# Patient Record
Sex: Male | Born: 1935 | Race: White | Hispanic: No | Marital: Married | Smoking: Former smoker
Health system: Southern US, Community
[De-identification: ages and names within clinical notes are randomized; demographics above are authoritative.]

## PROBLEM LIST (undated history)

## (undated) DIAGNOSIS — K219 Gastro-esophageal reflux disease without esophagitis: Secondary | ICD-10-CM

## (undated) DIAGNOSIS — N183 Chronic kidney disease, stage 3 unspecified: Secondary | ICD-10-CM

## (undated) DIAGNOSIS — D0359 Melanoma in situ of other part of trunk: Secondary | ICD-10-CM

## (undated) DIAGNOSIS — D509 Iron deficiency anemia, unspecified: Secondary | ICD-10-CM

## (undated) DIAGNOSIS — M1990 Unspecified osteoarthritis, unspecified site: Secondary | ICD-10-CM

## (undated) DIAGNOSIS — K649 Unspecified hemorrhoids: Secondary | ICD-10-CM

## (undated) DIAGNOSIS — C801 Malignant (primary) neoplasm, unspecified: Secondary | ICD-10-CM

## (undated) DIAGNOSIS — K2270 Barrett's esophagus without dysplasia: Secondary | ICD-10-CM

## (undated) DIAGNOSIS — E785 Hyperlipidemia, unspecified: Secondary | ICD-10-CM

## (undated) DIAGNOSIS — K635 Polyp of colon: Secondary | ICD-10-CM

## (undated) DIAGNOSIS — I10 Essential (primary) hypertension: Secondary | ICD-10-CM

## (undated) HISTORY — DX: Unspecified osteoarthritis, unspecified site: M19.90

## (undated) HISTORY — DX: Barrett's esophagus without dysplasia: K22.70

## (undated) HISTORY — DX: Chronic kidney disease, stage 3 unspecified: N18.30

## (undated) HISTORY — DX: Hyperlipidemia, unspecified: E78.5

## (undated) HISTORY — DX: Unspecified hemorrhoids: K64.9

## (undated) HISTORY — DX: Essential (primary) hypertension: I10

## (undated) HISTORY — DX: Gastro-esophageal reflux disease without esophagitis: K21.9

## (undated) HISTORY — DX: Chronic kidney disease, stage 3 (moderate): N18.3

## (undated) HISTORY — PX: HERNIA REPAIR: SHX51

## (undated) HISTORY — DX: Malignant (primary) neoplasm, unspecified: C80.1

## (undated) HISTORY — DX: Iron deficiency anemia, unspecified: D50.9

## (undated) HISTORY — DX: Polyp of colon: K63.5

## (undated) HISTORY — PX: APPENDECTOMY: SHX54

## (undated) HISTORY — DX: Melanoma in situ of other part of trunk: D03.59

---

## 2010-09-08 DIAGNOSIS — K2270 Barrett's esophagus without dysplasia: Secondary | ICD-10-CM

## 2010-09-08 HISTORY — DX: Barrett's esophagus without dysplasia: K22.70

## 2011-12-24 DIAGNOSIS — I10 Essential (primary) hypertension: Secondary | ICD-10-CM | POA: Diagnosis not present

## 2011-12-24 DIAGNOSIS — E785 Hyperlipidemia, unspecified: Secondary | ICD-10-CM | POA: Diagnosis not present

## 2011-12-24 DIAGNOSIS — D649 Anemia, unspecified: Secondary | ICD-10-CM | POA: Diagnosis not present

## 2011-12-25 DIAGNOSIS — I10 Essential (primary) hypertension: Secondary | ICD-10-CM | POA: Diagnosis not present

## 2011-12-25 DIAGNOSIS — E785 Hyperlipidemia, unspecified: Secondary | ICD-10-CM | POA: Diagnosis not present

## 2012-01-05 DIAGNOSIS — H539 Unspecified visual disturbance: Secondary | ICD-10-CM | POA: Diagnosis not present

## 2012-04-29 DIAGNOSIS — E785 Hyperlipidemia, unspecified: Secondary | ICD-10-CM | POA: Diagnosis not present

## 2012-04-29 DIAGNOSIS — I10 Essential (primary) hypertension: Secondary | ICD-10-CM | POA: Diagnosis not present

## 2012-04-29 DIAGNOSIS — N289 Disorder of kidney and ureter, unspecified: Secondary | ICD-10-CM | POA: Diagnosis not present

## 2012-04-29 DIAGNOSIS — D649 Anemia, unspecified: Secondary | ICD-10-CM | POA: Diagnosis not present

## 2012-05-20 DIAGNOSIS — Z23 Encounter for immunization: Secondary | ICD-10-CM | POA: Diagnosis not present

## 2012-07-16 DIAGNOSIS — E785 Hyperlipidemia, unspecified: Secondary | ICD-10-CM | POA: Diagnosis not present

## 2012-07-16 DIAGNOSIS — Z1331 Encounter for screening for depression: Secondary | ICD-10-CM | POA: Diagnosis not present

## 2012-07-16 DIAGNOSIS — J329 Chronic sinusitis, unspecified: Secondary | ICD-10-CM | POA: Diagnosis not present

## 2012-08-17 DIAGNOSIS — M6520 Calcific tendinitis, unspecified site: Secondary | ICD-10-CM | POA: Diagnosis not present

## 2012-11-19 DIAGNOSIS — M545 Low back pain: Secondary | ICD-10-CM | POA: Diagnosis not present

## 2012-11-19 DIAGNOSIS — IMO0002 Reserved for concepts with insufficient information to code with codable children: Secondary | ICD-10-CM | POA: Diagnosis not present

## 2012-12-30 DIAGNOSIS — R197 Diarrhea, unspecified: Secondary | ICD-10-CM | POA: Diagnosis not present

## 2013-01-24 DIAGNOSIS — M1710 Unilateral primary osteoarthritis, unspecified knee: Secondary | ICD-10-CM | POA: Diagnosis not present

## 2013-05-02 DIAGNOSIS — Z125 Encounter for screening for malignant neoplasm of prostate: Secondary | ICD-10-CM | POA: Diagnosis not present

## 2013-05-02 DIAGNOSIS — I10 Essential (primary) hypertension: Secondary | ICD-10-CM | POA: Diagnosis not present

## 2013-05-02 DIAGNOSIS — E079 Disorder of thyroid, unspecified: Secondary | ICD-10-CM | POA: Diagnosis not present

## 2013-05-02 DIAGNOSIS — E785 Hyperlipidemia, unspecified: Secondary | ICD-10-CM | POA: Diagnosis not present

## 2013-05-20 DIAGNOSIS — H524 Presbyopia: Secondary | ICD-10-CM | POA: Diagnosis not present

## 2013-05-20 DIAGNOSIS — H25019 Cortical age-related cataract, unspecified eye: Secondary | ICD-10-CM | POA: Diagnosis not present

## 2013-05-20 DIAGNOSIS — H2510 Age-related nuclear cataract, unspecified eye: Secondary | ICD-10-CM | POA: Diagnosis not present

## 2013-05-20 DIAGNOSIS — H5200 Hypermetropia, unspecified eye: Secondary | ICD-10-CM | POA: Diagnosis not present

## 2013-05-24 DIAGNOSIS — E875 Hyperkalemia: Secondary | ICD-10-CM | POA: Diagnosis not present

## 2013-05-24 DIAGNOSIS — Z0000 Encounter for general adult medical examination without abnormal findings: Secondary | ICD-10-CM | POA: Diagnosis not present

## 2013-05-24 DIAGNOSIS — Z125 Encounter for screening for malignant neoplasm of prostate: Secondary | ICD-10-CM | POA: Diagnosis not present

## 2013-05-24 DIAGNOSIS — R197 Diarrhea, unspecified: Secondary | ICD-10-CM | POA: Diagnosis not present

## 2013-05-24 DIAGNOSIS — H9190 Unspecified hearing loss, unspecified ear: Secondary | ICD-10-CM | POA: Diagnosis not present

## 2013-05-24 DIAGNOSIS — I10 Essential (primary) hypertension: Secondary | ICD-10-CM | POA: Diagnosis not present

## 2013-05-24 DIAGNOSIS — R269 Unspecified abnormalities of gait and mobility: Secondary | ICD-10-CM | POA: Diagnosis not present

## 2013-05-31 DIAGNOSIS — R42 Dizziness and giddiness: Secondary | ICD-10-CM | POA: Diagnosis not present

## 2013-05-31 DIAGNOSIS — H905 Unspecified sensorineural hearing loss: Secondary | ICD-10-CM | POA: Diagnosis not present

## 2013-06-08 DIAGNOSIS — H905 Unspecified sensorineural hearing loss: Secondary | ICD-10-CM | POA: Diagnosis not present

## 2013-08-12 ENCOUNTER — Ambulatory Visit (INDEPENDENT_AMBULATORY_CARE_PROVIDER_SITE_OTHER): Payer: Medicare Other | Admitting: Internal Medicine

## 2013-08-12 ENCOUNTER — Encounter: Payer: Self-pay | Admitting: Internal Medicine

## 2013-08-12 VITALS — BP 130/80 | HR 72 | Ht 68.5 in | Wt 172.4 lb

## 2013-08-12 DIAGNOSIS — K2270 Barrett's esophagus without dysplasia: Secondary | ICD-10-CM

## 2013-08-12 DIAGNOSIS — K219 Gastro-esophageal reflux disease without esophagitis: Secondary | ICD-10-CM

## 2013-08-12 DIAGNOSIS — R197 Diarrhea, unspecified: Secondary | ICD-10-CM

## 2013-08-12 MED ORDER — OMEPRAZOLE 20 MG PO CPDR: 20.0000 mg | DELAYED_RELEASE_CAPSULE | Freq: Every day | ORAL | Status: DC

## 2013-08-12 MED ORDER — RESTORA PO CAPS: 1.0000 | ORAL_CAPSULE | Freq: Every day | ORAL | Status: DC

## 2013-08-12 NOTE — Patient Instructions (Signed)
We have sent the following medications to your pharmacy for you to pick up at your convenience:  Omeprazole  You have been given some samples of Restora - take one a day until they are gone.  You have been given some information on Barretts.  Call our office when you return from [place] to schedule an endoscopy

## 2013-08-12 NOTE — Progress Notes (Signed)
HISTORY OF PRESENT ILLNESS:  Eric [place] is a 79 y.o. male below listed medical history who presents today to establish as a GI patient. He has a history of adenomatous colon polyps. Reflux esophagitis and Barrett's esophagus as well. The patient is accompanied by his wife. Outside records from [place] reviewed. Patient has a history of colon polyps and underwent surveillance colonoscopy 04/08/2011. The examination was complete to the cecum with good preparation. In addition to mild diverticulosis he was noted to have a 5 mm descending colon polyp which was removed and found to be a tubular adenoma. Followup in 4 years recommended. Next, in August of 2012 he underwent upper endoscopy to evaluate anemia. He was said to have grade 2 esophagitis as well as gastritis with multiple erosions. Flattened duodenal mucosa. Biopsies of the duodenum were normal. Biopsies of the stomach revealed mild gastritis without Helicobacter pylori. Biopsies of the esophagus revealed mild esophagitis as well as "Barrett's esophagus". No dysplasia. No Barrett's described on the endoscopic report. Patient states that he was on Nexium for a while. January 2014 he received a recall letter for upper endoscopy. He inquires about this. He denies reflux symptoms such as heartburn regurgitation water brash or dysphagia. His only other GI complaints are increased urgency with loose stools intermittently. Occasionally at night. Hyperactive bowel sounds. No bleeding or incontinence. No change in medications. Outside laboratories have been reviewed and are unremarkable. Normal B12, iron studies, CRP, and TSH. The patient and his wife Eric [place] in a few weeks. They will be there for 3 months in a timeshare  REVIEW OF SYSTEMS:  All non-GI ROS negative upon full and comprehensive review  Past Medical History  Diagnosis Date  . Colon polyps   . Hemorrhoids   . GERD (gastroesophageal reflux disease)   . Osteoarthritis   . Melanoma in  situ of trunk   . Hypertension   . Hyperlipidemia   . Iron deficiency anemia   . Chronic kidney disease (CKD), stage III (moderate)     Past Surgical History  Procedure Laterality Date  . Hernia repair    . Appendectomy      Social History Eric [place]  reports that he quit smoking about 14 years ago. His smoking use included Cigarettes. He smoked 0.50 packs per day. He has never used smokeless tobacco. He reports that he drinks alcohol. He reports that he does not use illicit drugs.  family history includes Arthritis in his father and mother; Breast cancer in his mother; Diabetes in his mother; Lung cancer in his father.  No Known Allergies     PHYSICAL EXAMINATION:  Vital signs: BP 130/80  Pulse 72  Ht 5' 8.5" (1.74 m)  Wt 172 lb 6.4 oz (78.2 kg)  BMI 25.83 kg/m2  Constitutional: generally well-appearing, no acute distress Psychiatric: alert and oriented x3, cooperative Eyes: extraocular movements intact, anicteric, conjunctiva pink Mouth: oral pharynx moist, no lesions Neck: supple no lymphadenopathy Cardiovascular: heart regular rate and rhythm, no murmur Lungs: clear to auscultation bilaterally Abdomen: soft, nontender, nondistended, no obvious ascites, no peritoneal signs, normal bowel sounds, no organomegaly Rectal: Omitted Extremities: no lower extremity edema bilaterally Skin: no lesions on visible extremities Neuro: No focal deficits. No asterixis.    ASSESSMENT:  #1. GERD. Endoscopic erosive esophagitis. Currently asymptomatic off PPI #2. "Barrett's esophagus" on esophageal biopsy. Clinical significance uncertain. Needs clarified . Comprehensive discussion today on Barrett's esophagus with the patient and his wife. As well, literature on the topic provided for  his review #3. History of adenomatous colon polyps. Diminutive adenoma 2012. Typical followup would be 5 years later, or 2017. At that time the patient will be 82. Question need for ongoing  surveillance. #4. General medical problems. Stable. #5. Complaints of intermittent loose stools with urgency. Hyperactive bowel sounds. Nonspecific. No alarm features.   PLAN:  #1. Probiotic daily for 3 weeks. See if this helps with change in bowel habits and hyperactive bowel sounds. Multiple samples given #2. Given history of erosive esophagitis and questionable history of Barrett's, would place him on once daily PPI. Prescribe omeprazole 20 mg daily. #3. After being on PPI for a minimum of 8 weeks would then schedule upper endoscopy to reassess his esophageal mucosa and biopsy as needed. The patient and his wife will contact the office when it returns from [place].The nature of the procedure, as well as the risks, benefits, and alternatives were carefully and thoroughly reviewed with the patient. Ample time for discussion and questions allowed. The patient understood, was satisfied, and agreed to proceed. #4. Consider surveillance colonoscopy at age 72, but likely unnecessary #5. Ongoing general medical care with Eric [place]

## 2014-01-02 ENCOUNTER — Encounter: Payer: Self-pay | Admitting: Internal Medicine

## 2014-01-05 ENCOUNTER — Telehealth: Payer: Self-pay | Admitting: Internal Medicine

## 2014-01-05 ENCOUNTER — Encounter: Payer: Self-pay | Admitting: *Deleted

## 2014-01-05 NOTE — Telephone Encounter (Signed)
Pt states he started having rectal bleeding on Sunday. States that it is bright red blood and it colors the toilet water. Pt scheduled to see Alonza Bogus PA tomorrow at 1:30pm. Pt aware of appt.

## 2014-01-06 ENCOUNTER — Other Ambulatory Visit (INDEPENDENT_AMBULATORY_CARE_PROVIDER_SITE_OTHER): Payer: Medicare Other

## 2014-01-06 ENCOUNTER — Ambulatory Visit (INDEPENDENT_AMBULATORY_CARE_PROVIDER_SITE_OTHER): Payer: Medicare Other | Admitting: Gastroenterology

## 2014-01-06 ENCOUNTER — Encounter: Payer: Self-pay | Admitting: Gastroenterology

## 2014-01-06 VITALS — BP 124/62 | HR 68 | Ht 68.5 in | Wt 174.0 lb

## 2014-01-06 DIAGNOSIS — K2270 Barrett's esophagus without dysplasia: Secondary | ICD-10-CM

## 2014-01-06 DIAGNOSIS — K625 Hemorrhage of anus and rectum: Secondary | ICD-10-CM

## 2014-01-06 LAB — CBC
HEMATOCRIT: 34 % — AB (ref 39.0–52.0)
Hemoglobin: 11.6 g/dL — ABNORMAL LOW (ref 13.0–17.0)
MCHC: 34 g/dL (ref 30.0–36.0)
MCV: 95.8 fl (ref 78.0–100.0)
Platelets: 174 10*3/uL (ref 150.0–400.0)
RBC: 3.55 Mil/uL — AB (ref 4.22–5.81)
RDW: 14.6 % (ref 11.5–14.6)
WBC: 5.9 10*3/uL (ref 4.5–10.5)

## 2014-01-06 MED ORDER — OMEPRAZOLE 20 MG PO CPDR: 20.0000 mg | DELAYED_RELEASE_CAPSULE | Freq: Every day | ORAL | Status: DC

## 2014-01-06 MED ORDER — HYDROCORTISONE ACETATE 25 MG RE SUPP: 25.0000 mg | Freq: Two times a day (BID) | RECTAL | Status: DC

## 2014-01-06 MED ORDER — OMEPRAZOLE 20 MG PO CPDR: 20.0000 mg | DELAYED_RELEASE_CAPSULE | Freq: Every day | ORAL | Status: AC

## 2014-01-06 NOTE — Patient Instructions (Addendum)
You have been scheduled for an endoscopy with propofol with Dr Henrene Pastor. Please follow written instructions given to you at your visit today. If you use inhalers (even only as needed), please bring them with you on the day of your procedure. Your physician has requested that you go to www.startemmi.com and enter the access code given to you at your visit today. This web site gives a general overview about your procedure. However, you should still follow specific instructions given to you by our office regarding your preparation for the procedure.  We have sent the following medications to [facility] for you to pick up at your convenience: Anusol Suppositories Prilosec, thirty day supply  We have sent the following medications to [facility]: Prilosec, ninety day supply   Your physician has requested that you go to the basement for the following lab work before leaving today: CBC _________________________________________________________________________________________________________________________________________________________________________________________________  Pearson Grippe A sitz bath is a warm water bath taken in the sitting position that covers only the hips and buttocks. It may be used for either healing or hygiene purposes. Sitz baths are also used to relieve pain, itching, or muscle spasms. The water may contain medicine. Moist heat will help you heal and relax.  HOME CARE INSTRUCTIONS  Take 3 to 4 sitz baths a day. 1. Fill the bathtub half full with warm water. 2. Sit in the water and open the drain a little. 3. Turn on the warm water to keep the tub half full. Keep the water running constantly. 4. Soak in the water for 15 to 20 minutes. 5. After the sitz bath, pat the affected area dry first. SEEK MEDICAL CARE IF:  You get worse instead of better. Stop the sitz baths if you get worse. MAKE SURE YOU:  Understand these instructions.  Will watch your condition.  Will get help right  away if you are not doing well or get worse. Document Released: 05/17/2004 Document Revised: 05/19/2012 Document Reviewed: 11/22/2010 [facility] Patient Information 2014 [place], [place].

## 2014-01-06 NOTE — Progress Notes (Signed)
01/06/2014 Eric Barker [id number] 07-19-1934   History of Present Illness:  This is a pleasant 80 year old male who presents to our office today with his wife for complaints of rectal bleeding.  This began on Sunday when he had a hard stool.  He normally does not have a problem with hard stools/constipation.  On Sunday and Monday it was a significant amount of bright red blood in the toilet bowl and splashed on the sides of the bowl.  It has subsided, but is still present with BM's.  He was even passing small amounts of blood when he would pass flatus.  No abdominal or rectal pain.  His last colonoscopy was in 03/2011 in [place] at which time he was noted to have hemorrhoids, diverticulosis, and one polyp removed from the ascending colon that was a TA.  He has Barrett's esophagus and GERD and is currently taking omeprazole 20 mg once daily for those symptoms.  He would like refills on that medication.  He is scheduled for an EGD for surveillance of his Barrett's on 6/2 with Dr. Henrene Pastor.   Current Medications, Allergies, Past Medical History, Past Surgical History, Family History and Social History were reviewed in [employer] record.   Physical Exam: BP 124/62  Pulse 68  Ht 5' 8.5" (1.74 m)  Wt 174 lb (78.926 kg)  BMI 26.07 kg/m2 General: Well developed male in no acute distress Head: Normocephalic and atraumatic Eyes:  Sclerae anicteric, conjunctiva pink  Ears: Normal auditory acuity Lungs: Clear throughout to auscultation Heart: Regular rate and rhythm Abdomen: Soft, non-distended.  Normal bowel sounds.  Non-tender. Rectal:  Large external hemorrhoid noted without active bleeding.  No masses felt on DRE.  Trace amount of light brown stool noted on exam glove with no gross blood, but was heme positive. Musculoskeletal: Symmetrical with no gross deformities  Extremities: No edema  Neurological: Alert oriented x 4, grossly non-focal Psychological:  Alert and  cooperative. Normal mood and affect  Assessment and Recommendations: -Rectal bleeding:  Likely outlet bleeding such as hemorrhoidal or low-grade diverticular bleeding.  Will give rectal care instructions.  Will give hydrocortisone suppositories to use twice daily.  Will check Hgb today. -Barrett's esophagus:  Scheduled for EGD on 6/2 with Dr. Henrene Pastor.  Will discuss instructions today and cancel pre-visit.  Continue omeprazole 20 mg daily and will refill that medication.

## 2014-01-09 NOTE — Progress Notes (Signed)
Agree 

## 2014-01-12 ENCOUNTER — Telehealth: Payer: Self-pay | Admitting: Gastroenterology

## 2014-01-12 DIAGNOSIS — K625 Hemorrhage of anus and rectum: Secondary | ICD-10-CM

## 2014-01-12 NOTE — Telephone Encounter (Signed)
See Dr. Blanch Media note.  Thank you,  Jess

## 2014-01-12 NOTE — Telephone Encounter (Signed)
Jess, See him in the office tomorrow. We will see what the next 24 hours holds. Repeat CBC tomorrow. If we need to, I have an 8 AM opening Monday for colonoscopy. We can decide tomorrow. I am supervising. Thanks

## 2014-01-12 NOTE — Telephone Encounter (Signed)
Patient saw Alonza Bogus, PA last Friday. He has been using the suppositories but is not better. Wife states he is probably worse. He had diarrhea all day yesterday, some of it bloody.(bright, red). Yesterday, he was bloated and had gas. States he had stomach pain below belly button that was all across his abdomen. He is up today and no problems yet. He is worried because his grandson graduates from college next week and he is not sure he will be able to attend at this point. Please, advise.

## 2014-01-12 NOTE — Telephone Encounter (Signed)
Dr. Henrene Pastor,  Could you review and let me know how else you would like me to proceed with this patient?  Thank you,  Jess

## 2014-01-12 NOTE — Telephone Encounter (Signed)
Spoke with patient's wife and scheduled OV with Tye Savoy, NP on 01/13/14 at 10:00 AM. CBC in EPIC and patient to have this prior to OV.

## 2014-01-13 ENCOUNTER — Ambulatory Visit (INDEPENDENT_AMBULATORY_CARE_PROVIDER_SITE_OTHER): Payer: Medicare Other | Admitting: Nurse Practitioner

## 2014-01-13 ENCOUNTER — Other Ambulatory Visit (INDEPENDENT_AMBULATORY_CARE_PROVIDER_SITE_OTHER): Payer: Medicare Other

## 2014-01-13 ENCOUNTER — Encounter: Payer: Self-pay | Admitting: Nurse Practitioner

## 2014-01-13 VITALS — BP 146/74 | HR 78 | Ht 68.5 in | Wt 170.0 lb

## 2014-01-13 DIAGNOSIS — K625 Hemorrhage of anus and rectum: Secondary | ICD-10-CM

## 2014-01-13 DIAGNOSIS — K648 Other hemorrhoids: Secondary | ICD-10-CM

## 2014-01-13 LAB — CBC WITH DIFFERENTIAL/PLATELET
Basophils Absolute: 0 10*3/uL (ref 0.0–0.1)
Basophils Relative: 0.8 % (ref 0.0–3.0)
Eosinophils Absolute: 0.1 10*3/uL (ref 0.0–0.7)
Eosinophils Relative: 1.4 % (ref 0.0–5.0)
HCT: 35.3 % — ABNORMAL LOW (ref 39.0–52.0)
Hemoglobin: 12 g/dL — ABNORMAL LOW (ref 13.0–17.0)
Lymphocytes Relative: 25.5 % (ref 12.0–46.0)
Lymphs Abs: 1.2 10*3/uL (ref 0.7–4.0)
MCHC: 33.9 g/dL (ref 30.0–36.0)
MCV: 95.4 fl (ref 78.0–100.0)
MONO ABS: 0.5 10*3/uL (ref 0.1–1.0)
Monocytes Relative: 11.2 % (ref 3.0–12.0)
NEUTROS PCT: 61.1 % (ref 43.0–77.0)
Neutro Abs: 2.8 10*3/uL (ref 1.4–7.7)
PLATELETS: 221 10*3/uL (ref 150.0–400.0)
RBC: 3.71 Mil/uL — ABNORMAL LOW (ref 4.22–5.81)
RDW: 13.9 % (ref 11.5–15.5)
WBC: 4.6 10*3/uL (ref 4.0–10.5)

## 2014-01-13 MED ORDER — HYDROCORTISONE 2.5 % RE CREA: 1.0000 "application " | TOPICAL_CREAM | Freq: Two times a day (BID) | RECTAL | Status: DC

## 2014-01-13 NOTE — Progress Notes (Signed)
     History of Present Illness:  This is a 80 year old male who was seen here a few days ago by Alonza Bogus, P.A for rectal bleeding subsequent to  passage of a hard stool. He normally does not have a problem with hard stools/constipation. On exam patient was found to have a large external hemorrhoid and was prescribed hydrocortisone suppositories twice daily. Hemoglobin on that day was 11.6. This morning a repeat hemoglobin was 12.  His last colonoscopy was in 03/2011 in [place] at which time he was noted to have hemorrhoids, diverticulosis, and one polyp removed from the ascending colon that was a TA.   Patient is slightly better after a week of steroid suppositories. He did start stool softeners and subsequently developed some abdominal discomfort followed by multiple loose stools. The last few stools contained scant amount of blood.   Current Medications, Allergies, Past Medical History, Past Surgical History, Family History and Social History were reviewed in [employer] record.  Physical Exam: General: Pleasant, well developed , white male in no acute distress Head: Normocephalic and atraumatic Eyes:  sclerae anicteric, conjunctiva pink  Ears: Normal auditory acuity Lungs: Clear throughout to auscultation Heart: Regular rate and rhythm Abdomen: Soft, non distended, non-tender. No masses, no hepatomegaly. Normal bowel sounds Rectal: mildly inflamed external hemorrhoid. On anoscopy there was an inflamed left lateral internal hemorrhoid Neurological: Alert oriented x 4, grossly nonfocal Psychological:  Alert and cooperative. Normal mood and affect  Assessment and Recommendations:  55. 80 year old male with painless rectal bleeding with BMs. He has an internal hemorrhoid on exam. Advised patient to complete the remaining prescription of steroid cream.Following that he will return for recheck. If hemorrhoidal swelling resolves but bleeding persists patient will need  further workup.   2. Bowel change. Two year history of loose stools. Suspect this is functional but will address in more depth at follow up visit  3. Barrett's esophagus. He is for surveillance EGD next month

## 2014-01-13 NOTE — Patient Instructions (Addendum)
You can purchase Glycerin Suppositories over the counter and please use as needed for constipation.  Please stop all stool softeners.  Please call us back at [phone] the week of the 01-30-2014 to schedule an appointment with one of our extenders in the office for follow up visit.   We have sent the following medications to your pharmacy for you to pick up at your convenience: Proctozone 2.5 % Comes with applicator for you to insert into your rectum twice daily

## 2014-01-14 ENCOUNTER — Encounter: Payer: Self-pay | Admitting: Nurse Practitioner

## 2014-01-14 DIAGNOSIS — K648 Other hemorrhoids: Secondary | ICD-10-CM | POA: Insufficient documentation

## 2014-01-16 NOTE — Progress Notes (Signed)
Agree with Initial assessment and plans as outlined

## 2014-01-31 DIAGNOSIS — Z6825 Body mass index (BMI) 25.0-25.9, adult: Secondary | ICD-10-CM | POA: Diagnosis not present

## 2014-01-31 DIAGNOSIS — IMO0002 Reserved for concepts with insufficient information to code with codable children: Secondary | ICD-10-CM | POA: Diagnosis not present

## 2014-02-07 ENCOUNTER — Encounter: Payer: Self-pay | Admitting: Internal Medicine

## 2014-02-07 ENCOUNTER — Ambulatory Visit (AMBULATORY_SURGERY_CENTER): Payer: Medicare Other | Admitting: Internal Medicine

## 2014-02-07 VITALS — BP 159/104 | HR 74 | Temp 98.2°F | Resp 20 | Ht 68.0 in | Wt 170.0 lb

## 2014-02-07 DIAGNOSIS — I10 Essential (primary) hypertension: Secondary | ICD-10-CM | POA: Diagnosis not present

## 2014-02-07 DIAGNOSIS — Z86010 Personal history of colonic polyps: Secondary | ICD-10-CM | POA: Diagnosis not present

## 2014-02-07 DIAGNOSIS — K2270 Barrett's esophagus without dysplasia: Secondary | ICD-10-CM

## 2014-02-07 DIAGNOSIS — K219 Gastro-esophageal reflux disease without esophagitis: Secondary | ICD-10-CM | POA: Diagnosis not present

## 2014-02-07 DIAGNOSIS — M1990 Unspecified osteoarthritis, unspecified site: Secondary | ICD-10-CM | POA: Diagnosis not present

## 2014-02-07 DIAGNOSIS — K649 Unspecified hemorrhoids: Secondary | ICD-10-CM | POA: Diagnosis not present

## 2014-02-07 MED ORDER — SODIUM CHLORIDE 0.9 % IV SOLN: 500.0000 mL | INTRAVENOUS | Status: DC

## 2014-02-07 NOTE — Patient Instructions (Signed)
FOLLOW DISCHARGE INSTRUCTIONS (BLUE AND GREEN SHEETS).YOU HAD AN ENDOSCOPIC PROCEDURE TODAY AT [facility]: Refer to the procedure report that was given to you for any specific questions about what was found during the examination.  If the procedure report does not answer your questions, please call your gastroenterologist to clarify.  If you requested that your care partner not be given the details of your procedure findings, then the procedure report has been included in a sealed envelope for you to review at your convenience later.  YOU SHOULD EXPECT: Some feelings of bloating in the abdomen. Passage of more gas than usual.  Walking can help get rid of the air that was put into your GI tract during the procedure and reduce the bloating. If you had a lower endoscopy (such as a colonoscopy or flexible sigmoidoscopy) you may notice spotting of blood in your stool or on the toilet paper. If you underwent a bowel prep for your procedure, then you may not have a normal bowel movement for a few days.  DIET: Your first meal following the procedure should be a light meal and then it is ok to progress to your normal diet.  A half-sandwich or bowl of soup is an example of a good first meal.  Heavy or fried foods are harder to digest and may make you feel nauseous or bloated.  Likewise meals heavy in dairy and vegetables can cause extra gas to form and this can also increase the bloating.  Drink plenty of fluids but you should avoid alcoholic beverages for 24 hours.  ACTIVITY: Your care partner should take you home directly after the procedure.  You should plan to take it easy, moving slowly for the rest of the day.  You can resume normal activity the day after the procedure however you should NOT DRIVE or use heavy machinery for 24 hours (because of the sedation medicines used during the test).    SYMPTOMS TO REPORT IMMEDIATELY: A gastroenterologist can be reached at any hour.  During normal  business hours, 8:30 AM to 5:00 PM Monday through Friday, call [phone].  After hours and on weekends, please call the GI answering service at [phone] who will take a message and have the physician on call contact you.     Following upper endoscopy (EGD)  Vomiting of blood or coffee ground material  New chest pain or pain under the shoulder blades  Painful or persistently difficult swallowing  New shortness of breath  Fever of 100F or higher  Black, tarry-looking stools  FOLLOW UP: If any biopsies were taken you will be contacted by phone or by letter within the next 1-3 weeks.  Call your gastroenterologist if you have not heard about the biopsies in 3 weeks.  Our staff will call the home number listed on your records the next business day following your procedure to check on you and address any questions or concerns that you may have at that time regarding the information given to you following your procedure. This is a courtesy call and so if there is no answer at the home number and we have not heard from you through the emergency physician on call, we will assume that you have returned to your regular daily activities without incident.  SIGNATURES/CONFIDENTIALITy    You and/or your care partner have signed paperwork which will be entered into your electronic medical record.  These signatures attest to the fact that that the information above on your After  Visit Summary has been reviewed and is understood.  Full responsibility of the confidentiality of this discharge information lies with you and/or your care-partner.   Normal colonoscopy.    GERD information given.  Continue acid reflux medications.

## 2014-02-07 NOTE — Progress Notes (Signed)
Pt transferred to PACU without incidnet..VSS on arrival.  PACU RN at bedside.  Pt drowsy but adequate resp on RA and oriented.  No apparent anes complications

## 2014-02-07 NOTE — Op Note (Signed)
[place]  [address] [place], [id number]   ENDOSCOPY PROCEDURE REPORT  PATIENT: Eric [place], Eric Barker  MR#: [id number] BIRTHDATE: 12-10-1933 , 79  yrs. old GENDER: Male ENDOSCOPIST: Eustace Quail, MD REFERRED BY:  Velna Hatchet, M.D. PROCEDURE DATE:  02/07/2014 PROCEDURE:  EGD, diagnostic ASA CLASS:     Class II INDICATIONS:  History of esophageal reflux.   Barrett's (history of) 04-2011 (Elsewhere). MEDICATIONS: MAC sedation, administered by CRNA and propofol (Diprivan) 75mg  IV TOPICAL ANESTHETIC: none  DESCRIPTION OF PROCEDURE: After the risks benefits and alternatives of the procedure were thoroughly explained, informed consent was obtained.  The LB [id number] endoscope was introduced through the mouth and advanced to the second portion of the duodenum. Without limitations.  The instrument was slowly withdrawn as the mucosa was fully examined.    EXAM: The upper, middle and distal third of the esophagus were carefully inspected and no abnormalities were noted.  The z-line was well seen at the GEJ.  The endoscope was pushed into the fundus which was normal including a retroflexed view.  The antrum, gastric body, first and second part of the duodenum were unremarkable. Retroflexed views revealed a hiatal hernia.     The scope was then withdrawn from the patient and the procedure completed.  COMPLICATIONS: There were no complications. ENDOSCOPIC IMPRESSION: 1. Normal EGD 2. No Barrett's esophagus identified 3. GERD  RECOMMENDATIONS: 1.Continue current medication for acid reflux to control acid reflux symptoms. 2. No future surveillance endoscopy required  REPEAT EXAM:  eSigned:  Eustace Quail, MD 02/07/2014 3:08 PM   CC:The Patient  ; Ermalinda Memos

## 2014-02-08 ENCOUNTER — Telehealth: Payer: Self-pay

## 2014-02-08 NOTE — Telephone Encounter (Signed)
Unable to leave message patient gave a fax number to return call.

## 2014-05-22 DIAGNOSIS — I10 Essential (primary) hypertension: Secondary | ICD-10-CM | POA: Diagnosis not present

## 2014-05-22 DIAGNOSIS — E785 Hyperlipidemia, unspecified: Secondary | ICD-10-CM | POA: Diagnosis not present

## 2014-05-22 DIAGNOSIS — Z125 Encounter for screening for malignant neoplasm of prostate: Secondary | ICD-10-CM | POA: Diagnosis not present

## 2014-05-23 DIAGNOSIS — E079 Disorder of thyroid, unspecified: Secondary | ICD-10-CM | POA: Diagnosis not present

## 2014-05-29 DIAGNOSIS — N183 Chronic kidney disease, stage 3 unspecified: Secondary | ICD-10-CM | POA: Diagnosis not present

## 2014-05-29 DIAGNOSIS — E079 Disorder of thyroid, unspecified: Secondary | ICD-10-CM | POA: Diagnosis not present

## 2014-05-29 DIAGNOSIS — Z0000 Encounter for general adult medical examination without abnormal findings: Secondary | ICD-10-CM | POA: Diagnosis not present

## 2014-05-29 DIAGNOSIS — R197 Diarrhea, unspecified: Secondary | ICD-10-CM | POA: Diagnosis not present

## 2014-05-29 DIAGNOSIS — Z1331 Encounter for screening for depression: Secondary | ICD-10-CM | POA: Diagnosis not present

## 2014-05-29 DIAGNOSIS — E875 Hyperkalemia: Secondary | ICD-10-CM | POA: Diagnosis not present

## 2014-05-29 DIAGNOSIS — H9190 Unspecified hearing loss, unspecified ear: Secondary | ICD-10-CM | POA: Diagnosis not present

## 2014-05-29 DIAGNOSIS — I10 Essential (primary) hypertension: Secondary | ICD-10-CM | POA: Diagnosis not present

## 2014-05-29 DIAGNOSIS — R269 Unspecified abnormalities of gait and mobility: Secondary | ICD-10-CM | POA: Diagnosis not present

## 2014-05-29 DIAGNOSIS — Z23 Encounter for immunization: Secondary | ICD-10-CM | POA: Diagnosis not present

## 2014-05-31 DIAGNOSIS — Z1212 Encounter for screening for malignant neoplasm of rectum: Secondary | ICD-10-CM | POA: Diagnosis not present

## 2014-06-28 DIAGNOSIS — D485 Neoplasm of uncertain behavior of skin: Secondary | ICD-10-CM | POA: Diagnosis not present

## 2014-06-28 DIAGNOSIS — L821 Other seborrheic keratosis: Secondary | ICD-10-CM | POA: Diagnosis not present

## 2014-06-28 DIAGNOSIS — D225 Melanocytic nevi of trunk: Secondary | ICD-10-CM | POA: Diagnosis not present

## 2014-06-28 DIAGNOSIS — L814 Other melanin hyperpigmentation: Secondary | ICD-10-CM | POA: Diagnosis not present

## 2014-08-22 DIAGNOSIS — Z23 Encounter for immunization: Secondary | ICD-10-CM | POA: Diagnosis not present

## 2016-02-25 DIAGNOSIS — M79671 Pain in right foot: Secondary | ICD-10-CM | POA: Diagnosis not present

## 2016-02-25 DIAGNOSIS — M7989 Other specified soft tissue disorders: Secondary | ICD-10-CM | POA: Diagnosis not present

## 2016-10-03 DIAGNOSIS — M25562 Pain in left knee: Secondary | ICD-10-CM | POA: Diagnosis not present

## 2016-10-03 DIAGNOSIS — M170 Bilateral primary osteoarthritis of knee: Secondary | ICD-10-CM | POA: Diagnosis not present

## 2016-10-03 DIAGNOSIS — M25461 Effusion, right knee: Secondary | ICD-10-CM | POA: Diagnosis not present

## 2016-10-03 DIAGNOSIS — M25561 Pain in right knee: Secondary | ICD-10-CM | POA: Diagnosis not present

## 2016-12-09 DIAGNOSIS — M170 Bilateral primary osteoarthritis of knee: Secondary | ICD-10-CM | POA: Diagnosis not present

## 2016-12-09 DIAGNOSIS — M25562 Pain in left knee: Secondary | ICD-10-CM | POA: Diagnosis not present

## 2016-12-09 DIAGNOSIS — R262 Difficulty in walking, not elsewhere classified: Secondary | ICD-10-CM | POA: Diagnosis not present

## 2016-12-09 DIAGNOSIS — M25561 Pain in right knee: Secondary | ICD-10-CM | POA: Diagnosis not present

## 2016-12-09 DIAGNOSIS — M25461 Effusion, right knee: Secondary | ICD-10-CM | POA: Diagnosis not present

## 2016-12-15 DIAGNOSIS — M25562 Pain in left knee: Secondary | ICD-10-CM | POA: Diagnosis not present

## 2016-12-15 DIAGNOSIS — M25561 Pain in right knee: Secondary | ICD-10-CM | POA: Diagnosis not present

## 2016-12-15 DIAGNOSIS — M170 Bilateral primary osteoarthritis of knee: Secondary | ICD-10-CM | POA: Diagnosis not present

## 2016-12-16 DIAGNOSIS — E784 Other hyperlipidemia: Secondary | ICD-10-CM | POA: Diagnosis not present

## 2016-12-22 DIAGNOSIS — M25562 Pain in left knee: Secondary | ICD-10-CM | POA: Diagnosis not present

## 2016-12-22 DIAGNOSIS — M25561 Pain in right knee: Secondary | ICD-10-CM | POA: Diagnosis not present

## 2016-12-22 DIAGNOSIS — M170 Bilateral primary osteoarthritis of knee: Secondary | ICD-10-CM | POA: Diagnosis not present

## 2016-12-23 DIAGNOSIS — Z1389 Encounter for screening for other disorder: Secondary | ICD-10-CM | POA: Diagnosis not present

## 2016-12-23 DIAGNOSIS — I10 Essential (primary) hypertension: Secondary | ICD-10-CM | POA: Diagnosis not present

## 2016-12-23 DIAGNOSIS — E784 Other hyperlipidemia: Secondary | ICD-10-CM | POA: Diagnosis not present

## 2016-12-23 DIAGNOSIS — Z6824 Body mass index (BMI) 24.0-24.9, adult: Secondary | ICD-10-CM | POA: Diagnosis not present

## 2016-12-23 DIAGNOSIS — M25561 Pain in right knee: Secondary | ICD-10-CM | POA: Diagnosis not present

## 2016-12-23 DIAGNOSIS — M25562 Pain in left knee: Secondary | ICD-10-CM | POA: Diagnosis not present

## 2016-12-29 DIAGNOSIS — M25562 Pain in left knee: Secondary | ICD-10-CM | POA: Diagnosis not present

## 2016-12-29 DIAGNOSIS — M25561 Pain in right knee: Secondary | ICD-10-CM | POA: Diagnosis not present

## 2016-12-29 DIAGNOSIS — M170 Bilateral primary osteoarthritis of knee: Secondary | ICD-10-CM | POA: Diagnosis not present

## 2017-01-05 DIAGNOSIS — M170 Bilateral primary osteoarthritis of knee: Secondary | ICD-10-CM | POA: Diagnosis not present

## 2017-01-05 DIAGNOSIS — M25561 Pain in right knee: Secondary | ICD-10-CM | POA: Diagnosis not present

## 2017-01-05 DIAGNOSIS — M25562 Pain in left knee: Secondary | ICD-10-CM | POA: Diagnosis not present

## 2017-02-04 DIAGNOSIS — Z6824 Body mass index (BMI) 24.0-24.9, adult: Secondary | ICD-10-CM | POA: Diagnosis not present

## 2017-02-04 DIAGNOSIS — R238 Other skin changes: Secondary | ICD-10-CM | POA: Diagnosis not present

## 2017-04-06 DIAGNOSIS — M25562 Pain in left knee: Secondary | ICD-10-CM | POA: Diagnosis not present

## 2017-04-06 DIAGNOSIS — M170 Bilateral primary osteoarthritis of knee: Secondary | ICD-10-CM | POA: Diagnosis not present

## 2017-04-06 DIAGNOSIS — M25461 Effusion, right knee: Secondary | ICD-10-CM | POA: Diagnosis not present

## 2017-04-06 DIAGNOSIS — M25561 Pain in right knee: Secondary | ICD-10-CM | POA: Diagnosis not present

## 2017-04-14 DIAGNOSIS — M25561 Pain in right knee: Secondary | ICD-10-CM | POA: Diagnosis not present

## 2017-04-14 DIAGNOSIS — M170 Bilateral primary osteoarthritis of knee: Secondary | ICD-10-CM | POA: Diagnosis not present

## 2017-04-14 DIAGNOSIS — M25562 Pain in left knee: Secondary | ICD-10-CM | POA: Diagnosis not present

## 2017-04-21 DIAGNOSIS — M25561 Pain in right knee: Secondary | ICD-10-CM | POA: Diagnosis not present

## 2017-04-21 DIAGNOSIS — M25562 Pain in left knee: Secondary | ICD-10-CM | POA: Diagnosis not present

## 2017-04-21 DIAGNOSIS — M170 Bilateral primary osteoarthritis of knee: Secondary | ICD-10-CM | POA: Diagnosis not present

## 2017-05-07 DIAGNOSIS — H524 Presbyopia: Secondary | ICD-10-CM | POA: Diagnosis not present

## 2017-05-07 DIAGNOSIS — H2513 Age-related nuclear cataract, bilateral: Secondary | ICD-10-CM | POA: Diagnosis not present

## 2017-05-21 ENCOUNTER — Emergency Department (HOSPITAL_COMMUNITY)
Admission: EM | Admit: 2017-05-21 | Discharge: 2017-05-21 | Discharge status: Against Medical Advice | Payer: Medicare Other

## 2017-05-22 DIAGNOSIS — M25551 Pain in right hip: Secondary | ICD-10-CM | POA: Diagnosis not present

## 2017-05-25 ENCOUNTER — Encounter (HOSPITAL_COMMUNITY): Payer: Self-pay | Admitting: *Deleted

## 2017-05-25 DIAGNOSIS — S72144G Nondisplaced intertrochanteric fracture of right femur, subsequent encounter for closed fracture with delayed healing: Secondary | ICD-10-CM | POA: Diagnosis not present

## 2017-05-26 ENCOUNTER — Ambulatory Visit: Payer: Self-pay | Admitting: Orthopedic Surgery

## 2017-05-26 NOTE — Patient Instructions (Addendum)
Eric Barker  05/26/2017   Your procedure is scheduled on: 05-28-17  Report to [facility] Main  Entrance Take [name]  elevators to 3rd floor to  [place] at 830AM.   Call this number if you have problems the morning of surgery [phone]    Remember: ONLY 1 PERSON MAY GO WITH YOU TO SHORT STAY TO GET  READY MORNING OF YOUR SURGERY.  Do not eat food or drink liquids :After Midnight.     Take these medicines the morning of surgery with A SIP OF WATER: tylenol as needed, Vytorin ,  Hydrocodone if needed                                 You may not have any metal on your body including hair pins and              piercings  Do not wear jewelry, make-up, lotions, powders or perfumes, deodorant                Men may shave face and neck.   Do not bring valuables to the hospital. [name].  Contacts, dentures or bridgework may not be worn into surgery.  Leave suitcase in the car. After surgery it may be brought to your room.                Please read over the following fact sheets you were given: _____________________________________________________________________            [facility] - Preparing for Surgery Before surgery, you can play an important role.  Because skin is not sterile, your skin needs to be as free of germs as possible.  You can reduce the number of germs on your skin by washing with CHG (chlorahexidine gluconate) soap before surgery.  CHG is an antiseptic cleaner which kills germs and bonds with the skin to continue killing germs even after washing. Please DO NOT use if you have an allergy to CHG or antibacterial soaps.  If your skin becomes reddened/irritated stop using the CHG and inform your nurse when you arrive at Short Stay. Do not shave (including legs and underarms) for at least 48 hours prior to the first CHG shower.  You may shave your face/neck. Please follow these  instructions carefully:  1.  Shower with CHG Soap the night before surgery and the  morning of Surgery.  2.  If you choose to wash your hair, wash your hair first as usual with your  normal  shampoo.  3.  After you shampoo, rinse your hair and body thoroughly to remove the  shampoo.                           4.  Use CHG as you would any other liquid soap.  You can apply chg directly  to the skin and wash                       Gently with a scrungie or clean washcloth.  5.  Apply the CHG Soap to your body ONLY FROM THE NECK DOWN.   Do not use on face/ open  Wound or open sores. Avoid contact with eyes, ears mouth and genitals (private parts).                       Wash face,  Genitals (private parts) with your normal soap.             6.  Wash thoroughly, paying special attention to the area where your surgery  will be performed.  7.  Thoroughly rinse your body with warm water from the neck down.  8.  DO NOT shower/wash with your normal soap after using and rinsing off  the CHG Soap.                9.  Pat yourself dry with a clean towel.            10.  Wear clean pajamas.            11.  Place clean sheets on your bed the night of your first shower and do not  sleep with pets. Day of Surgery : Do not apply any lotions/deodorants the morning of surgery.  Please wear clean clothes to the hospital/surgery center.  FAILURE TO FOLLOW THESE INSTRUCTIONS MAY RESULT IN THE CANCELLATION OF YOUR SURGERY PATIENT SIGNATURE_________________________________  NURSE SIGNATURE__________________________________  ________________________________________________________________________   Eric Barker  An incentive spirometer is a tool that can help keep your lungs clear and active. This tool measures how well you are filling your lungs with each breath. Taking long deep breaths may help reverse or decrease the chance of developing breathing (pulmonary) problems (especially  infection) following:  A long period of time when you are unable to move or be active. BEFORE THE PROCEDURE   If the spirometer includes an indicator to show your best effort, your nurse or respiratory therapist will set it to a desired goal.  If possible, sit up straight or lean slightly forward. Try not to slouch.  Hold the incentive spirometer in an upright position. INSTRUCTIONS FOR USE  1. Sit on the edge of your bed if possible, or sit up as far as you can in bed or on a chair. 2. Hold the incentive spirometer in an upright position. 3. Breathe out normally. 4. Place the mouthpiece in your mouth and seal your lips tightly around it. 5. Breathe in slowly and as deeply as possible, raising the piston or the ball toward the top of the column. 6. Hold your breath for 3-5 seconds or for as long as possible. Allow the piston or ball to fall to the bottom of the column. 7. Remove the mouthpiece from your mouth and breathe out normally. 8. Rest for a few seconds and repeat Steps 1 through 7 at least 10 times every 1-2 hours when you are awake. Take your time and take a few normal breaths between deep breaths. 9. The spirometer may include an indicator to show your best effort. Use the indicator as a goal to work toward during each repetition. 10. After each set of 10 deep breaths, practice coughing to be sure your lungs are clear. If you have an incision (the cut made at the time of surgery), support your incision when coughing by placing a pillow or rolled up towels firmly against it. Once you are able to get out of bed, walk around indoors and cough well. You may stop using the incentive spirometer when instructed by your caregiver.  RISKS AND COMPLICATIONS  Take your time so you do not get  dizzy or light-headed.  If you are in pain, you may need to take or ask for pain medication before doing incentive spirometry. It is harder to take a deep breath if you are having pain. AFTER  USE  Rest and breathe slowly and easily.  It can be helpful to keep track of a log of your progress. Your caregiver can provide you with a simple table to help with this. If you are using the spirometer at home, follow these instructions: [place] IF:   You are having difficultly using the spirometer.  You have trouble using the spirometer as often as instructed.  Your pain medication is not giving enough relief while using the spirometer.  You develop fever of 100.5 F (38.1 C) or higher. SEEK IMMEDIATE MEDICAL CARE IF:   You cough up bloody sputum that had not been present before.  You develop fever of 102 F (38.9 C) or greater.  You develop worsening pain at or near the incision site. MAKE SURE YOU:   Understand these instructions.  Will watch your condition.  Will get help right away if you are not doing well or get worse. Document Released: 01/05/2007 Document Revised: 11/17/2011 Document Reviewed: 03/08/2007 [facility] Patient Information 2014 [place], [place].   ________________________________________________________________________

## 2017-05-26 NOTE — Progress Notes (Signed)
Fletcher NP 02-04-17 on chart  OV Dr Ardeth Perfect 12-23-16 on chart  EKG 06-06-16 on chart  Lipid and liver panel 12-16-16 on chart  CBCdiff 02-04-17 on chart  BMP 12-23-16 on chart

## 2017-05-27 ENCOUNTER — Encounter (HOSPITAL_COMMUNITY)
Admission: RE | Admit: 2017-05-27 | Discharge: 2017-05-27 | Disposition: A | Discharge status: Home / Self Care | Payer: Medicare Other | Source: Ambulatory Visit | Attending: Orthopedic Surgery | Admitting: Orthopedic Surgery

## 2017-05-27 ENCOUNTER — Encounter (HOSPITAL_COMMUNITY): Payer: Self-pay

## 2017-05-27 ENCOUNTER — Encounter (INDEPENDENT_AMBULATORY_CARE_PROVIDER_SITE_OTHER): Payer: Self-pay

## 2017-05-27 LAB — BASIC METABOLIC PANEL
Anion gap: 8 (ref 5–15)
BUN: 29 mg/dL — AB (ref 6–20)
CHLORIDE: 109 mmol/L (ref 101–111)
CO2: 22 mmol/L (ref 22–32)
Calcium: 9.1 mg/dL (ref 8.9–10.3)
Creatinine, Ser: 1.5 mg/dL — ABNORMAL HIGH (ref 0.61–1.24)
GFR calc Af Amer: 49 mL/min — ABNORMAL LOW (ref >60)
GFR calc non Af Amer: 42 mL/min — ABNORMAL LOW (ref >60)
GLUCOSE: 95 mg/dL (ref 65–99)
POTASSIUM: 4.9 mmol/L (ref 3.5–5.1)
SODIUM: 139 mmol/L (ref 135–145)

## 2017-05-27 LAB — CBC
HEMATOCRIT: 34.3 % — AB (ref 39.0–52.0)
Hemoglobin: 11.4 g/dL — ABNORMAL LOW (ref 13.0–17.0)
MCH: 32.1 pg (ref 26.0–34.0)
MCHC: 33.2 g/dL (ref 30.0–36.0)
MCV: 96.6 fL (ref 78.0–100.0)
Platelets: 217 10*3/uL (ref 150–400)
RBC: 3.55 MIL/uL — ABNORMAL LOW (ref 4.22–5.81)
RDW: 13.1 % (ref 11.5–15.5)
WBC: 4.3 10*3/uL (ref 4.0–10.5)

## 2017-05-27 NOTE — Progress Notes (Signed)
BMP routed via epic to Dr Lyla Glassing

## 2017-05-28 ENCOUNTER — Encounter (HOSPITAL_COMMUNITY): Payer: Self-pay | Admitting: *Deleted

## 2017-05-28 ENCOUNTER — Ambulatory Visit (HOSPITAL_COMMUNITY): Payer: Medicare Other

## 2017-05-28 ENCOUNTER — Observation Stay (HOSPITAL_COMMUNITY): Payer: Medicare Other

## 2017-05-28 ENCOUNTER — Inpatient Hospital Stay (HOSPITAL_COMMUNITY)
Admission: RE | Admit: 2017-05-28 | Discharge: 2017-05-29 | DRG: 482 | Disposition: A | Discharge status: Home / Self Care | Payer: Medicare Other | Source: Ambulatory Visit | Attending: Orthopedic Surgery | Admitting: Orthopedic Surgery

## 2017-05-28 ENCOUNTER — Ambulatory Visit (HOSPITAL_COMMUNITY): Payer: Medicare Other | Admitting: Registered Nurse

## 2017-05-28 ENCOUNTER — Encounter (HOSPITAL_COMMUNITY)
Admission: RE | Disposition: A | Discharge status: Home / Self Care | Payer: Self-pay | Source: Ambulatory Visit | Attending: Orthopedic Surgery

## 2017-05-28 DIAGNOSIS — S72001A Fracture of unspecified part of neck of right femur, initial encounter for closed fracture: Secondary | ICD-10-CM | POA: Diagnosis not present

## 2017-05-28 DIAGNOSIS — E785 Hyperlipidemia, unspecified: Secondary | ICD-10-CM | POA: Diagnosis present

## 2017-05-28 DIAGNOSIS — K219 Gastro-esophageal reflux disease without esophagitis: Secondary | ICD-10-CM | POA: Diagnosis not present

## 2017-05-28 DIAGNOSIS — K648 Other hemorrhoids: Secondary | ICD-10-CM | POA: Diagnosis not present

## 2017-05-28 DIAGNOSIS — Z87891 Personal history of nicotine dependence: Secondary | ICD-10-CM | POA: Diagnosis not present

## 2017-05-28 DIAGNOSIS — Z833 Family history of diabetes mellitus: Secondary | ICD-10-CM

## 2017-05-28 DIAGNOSIS — W19XXXA Unspecified fall, initial encounter: Secondary | ICD-10-CM | POA: Diagnosis present

## 2017-05-28 DIAGNOSIS — S72141A Displaced intertrochanteric fracture of right femur, initial encounter for closed fracture: Secondary | ICD-10-CM | POA: Diagnosis present

## 2017-05-28 DIAGNOSIS — Z09 Encounter for follow-up examination after completed treatment for conditions other than malignant neoplasm: Secondary | ICD-10-CM

## 2017-05-28 DIAGNOSIS — N183 Chronic kidney disease, stage 3 (moderate): Secondary | ICD-10-CM | POA: Diagnosis not present

## 2017-05-28 DIAGNOSIS — I129 Hypertensive chronic kidney disease with stage 1 through stage 4 chronic kidney disease, or unspecified chronic kidney disease: Secondary | ICD-10-CM | POA: Diagnosis present

## 2017-05-28 DIAGNOSIS — S72143A Displaced intertrochanteric fracture of unspecified femur, initial encounter for closed fracture: Secondary | ICD-10-CM

## 2017-05-28 DIAGNOSIS — S72144A Nondisplaced intertrochanteric fracture of right femur, initial encounter for closed fracture: Secondary | ICD-10-CM | POA: Diagnosis not present

## 2017-05-28 DIAGNOSIS — K2270 Barrett's esophagus without dysplasia: Secondary | ICD-10-CM | POA: Diagnosis not present

## 2017-05-28 HISTORY — PX: INTRAMEDULLARY (IM) NAIL INTERTROCHANTERIC: SHX5875

## 2017-05-28 SURGERY — FIXATION, FRACTURE, INTERTROCHANTERIC, WITH INTRAMEDULLARY ROD
Anesthesia: General | Laterality: Right

## 2017-05-28 MED ORDER — FENTANYL CITRATE (PF) 100 MCG/2ML IJ SOLN
INTRAMUSCULAR | Status: DC | PRN
  Administered 2017-05-28 (×3): 50 ug via INTRAVENOUS

## 2017-05-28 MED ORDER — LIDOCAINE 2% (20 MG/ML) 5 ML SYRINGE
INTRAMUSCULAR | Status: AC
  Filled 2017-05-28: qty 5

## 2017-05-28 MED ORDER — ONDANSETRON HCL 4 MG/2ML IJ SOLN
INTRAMUSCULAR | Status: AC
  Filled 2017-05-28: qty 2

## 2017-05-28 MED ORDER — MORPHINE SULFATE (PF) 4 MG/ML IV SOLN: 0.5000 mg | INTRAVENOUS | Status: DC | PRN

## 2017-05-28 MED ORDER — FENTANYL CITRATE (PF) 250 MCG/5ML IJ SOLN
INTRAMUSCULAR | Status: AC
  Filled 2017-05-28: qty 5

## 2017-05-28 MED ORDER — EZETIMIBE-SIMVASTATIN 10-20 MG PO TABS
1.0000 | ORAL_TABLET | Freq: Every day | ORAL | Status: DC
  Administered 2017-05-29: 10:00:00 1 via ORAL
  Filled 2017-05-28: qty 1

## 2017-05-28 MED ORDER — PANTOPRAZOLE SODIUM 40 MG PO TBEC
40.0000 mg | DELAYED_RELEASE_TABLET | Freq: Every day | ORAL | Status: DC
  Administered 2017-05-28 – 2017-05-29 (×2): 40 mg via ORAL
  Filled 2017-05-28 (×2): qty 1

## 2017-05-28 MED ORDER — PROPOFOL 10 MG/ML IV BOLUS
INTRAVENOUS | Status: AC
  Filled 2017-05-28: qty 20

## 2017-05-28 MED ORDER — METHOCARBAMOL 1000 MG/10ML IJ SOLN
500.0000 mg | Freq: Four times a day (QID) | INTRAMUSCULAR | Status: DC | PRN
  Administered 2017-05-28: 500 mg via INTRAVENOUS
  Filled 2017-05-28: qty 550

## 2017-05-28 MED ORDER — FENTANYL CITRATE (PF) 100 MCG/2ML IJ SOLN
INTRAMUSCULAR | Status: AC
  Filled 2017-05-28: qty 2

## 2017-05-28 MED ORDER — CHLORHEXIDINE GLUCONATE 4 % EX LIQD: 60.0000 mL | Freq: Once | CUTANEOUS | Status: DC

## 2017-05-28 MED ORDER — SUCCINYLCHOLINE CHLORIDE 200 MG/10ML IV SOSY
PREFILLED_SYRINGE | INTRAVENOUS | Status: AC
  Filled 2017-05-28: qty 10

## 2017-05-28 MED ORDER — ACETAMINOPHEN 650 MG RE SUPP: 650.0000 mg | Freq: Four times a day (QID) | RECTAL | Status: DC | PRN

## 2017-05-28 MED ORDER — METOCLOPRAMIDE HCL 5 MG PO TABS: 5.0000 mg | ORAL_TABLET | Freq: Three times a day (TID) | ORAL | Status: DC | PRN

## 2017-05-28 MED ORDER — PHENOL 1.4 % MT LIQD: 1.0000 | OROMUCOSAL | Status: DC | PRN

## 2017-05-28 MED ORDER — LOSARTAN POTASSIUM 50 MG PO TABS
50.0000 mg | ORAL_TABLET | Freq: Every day | ORAL | Status: DC
  Administered 2017-05-28 – 2017-05-29 (×2): 50 mg via ORAL
  Filled 2017-05-28 (×2): qty 1

## 2017-05-28 MED ORDER — ONDANSETRON HCL 4 MG/2ML IJ SOLN
INTRAMUSCULAR | Status: DC | PRN
  Administered 2017-05-28: 4 mg via INTRAVENOUS

## 2017-05-28 MED ORDER — PROPOFOL 10 MG/ML IV BOLUS
INTRAVENOUS | Status: DC | PRN
  Administered 2017-05-28: 130 mg via INTRAVENOUS

## 2017-05-28 MED ORDER — ONDANSETRON HCL 4 MG/2ML IJ SOLN: 4.0000 mg | Freq: Four times a day (QID) | INTRAMUSCULAR | Status: DC | PRN

## 2017-05-28 MED ORDER — SUCCINYLCHOLINE CHLORIDE 20 MG/ML IJ SOLN
INTRAMUSCULAR | Status: DC | PRN
  Administered 2017-05-28: 140 mg via INTRAVENOUS

## 2017-05-28 MED ORDER — LOPERAMIDE HCL 2 MG PO CAPS: 2.0000 mg | ORAL_CAPSULE | ORAL | Status: DC | PRN

## 2017-05-28 MED ORDER — METOCLOPRAMIDE HCL 5 MG/ML IJ SOLN: 5.0000 mg | Freq: Three times a day (TID) | INTRAMUSCULAR | Status: DC | PRN

## 2017-05-28 MED ORDER — DEXAMETHASONE SODIUM PHOSPHATE 10 MG/ML IJ SOLN
INTRAMUSCULAR | Status: AC
  Filled 2017-05-28: qty 1

## 2017-05-28 MED ORDER — MENTHOL 3 MG MT LOZG: 1.0000 | LOZENGE | OROMUCOSAL | Status: DC | PRN

## 2017-05-28 MED ORDER — AMLODIPINE BESYLATE 5 MG PO TABS
5.0000 mg | ORAL_TABLET | Freq: Every evening | ORAL | Status: DC
  Administered 2017-05-28: 17:00:00 5 mg via ORAL
  Filled 2017-05-28: qty 1

## 2017-05-28 MED ORDER — MIDAZOLAM HCL 2 MG/2ML IJ SOLN
INTRAMUSCULAR | Status: AC
  Filled 2017-05-28: qty 2

## 2017-05-28 MED ORDER — ONDANSETRON HCL 4 MG PO TABS: 4.0000 mg | ORAL_TABLET | Freq: Four times a day (QID) | ORAL | Status: DC | PRN

## 2017-05-28 MED ORDER — ROCURONIUM BROMIDE 100 MG/10ML IV SOLN
INTRAVENOUS | Status: DC | PRN
  Administered 2017-05-28: 5 mg via INTRAVENOUS

## 2017-05-28 MED ORDER — HYDROMORPHONE HCL-NACL 0.5-0.9 MG/ML-% IV SOSY
PREFILLED_SYRINGE | INTRAVENOUS | Status: AC
  Filled 2017-05-28: qty 2

## 2017-05-28 MED ORDER — HYDROCODONE-ACETAMINOPHEN 5-325 MG PO TABS
1.0000 | ORAL_TABLET | Freq: Four times a day (QID) | ORAL | Status: DC | PRN
  Administered 2017-05-28 – 2017-05-29 (×4): 1 via ORAL
  Filled 2017-05-28 (×4): qty 1

## 2017-05-28 MED ORDER — ROCURONIUM BROMIDE 50 MG/5ML IV SOSY
PREFILLED_SYRINGE | INTRAVENOUS | Status: AC
  Filled 2017-05-28: qty 5

## 2017-05-28 MED ORDER — EPHEDRINE SULFATE 50 MG/ML IJ SOLN
INTRAMUSCULAR | Status: DC | PRN
  Administered 2017-05-28: 10 mg via INTRAVENOUS

## 2017-05-28 MED ORDER — DEXAMETHASONE SODIUM PHOSPHATE 10 MG/ML IJ SOLN
INTRAMUSCULAR | Status: DC | PRN
  Administered 2017-05-28: 10 mg via INTRAVENOUS

## 2017-05-28 MED ORDER — FENTANYL CITRATE (PF) 100 MCG/2ML IJ SOLN
25.0000 ug | INTRAMUSCULAR | Status: DC | PRN
  Administered 2017-05-28: 50 ug via INTRAVENOUS
  Administered 2017-05-28 (×4): 25 ug via INTRAVENOUS

## 2017-05-28 MED ORDER — CEFAZOLIN SODIUM-DEXTROSE 2-4 GM/100ML-% IV SOLN
2.0000 g | Freq: Four times a day (QID) | INTRAVENOUS | Status: AC
  Administered 2017-05-28 – 2017-05-29 (×2): 2 g via INTRAVENOUS
  Filled 2017-05-28 (×2): qty 100

## 2017-05-28 MED ORDER — CEFAZOLIN SODIUM-DEXTROSE 2-4 GM/100ML-% IV SOLN
2.0000 g | INTRAVENOUS | Status: AC
  Administered 2017-05-28: 2 g via INTRAVENOUS
  Filled 2017-05-28: qty 100

## 2017-05-28 MED ORDER — HYDROMORPHONE HCL-NACL 0.5-0.9 MG/ML-% IV SOSY
0.2500 mg | PREFILLED_SYRINGE | INTRAVENOUS | Status: DC | PRN
  Administered 2017-05-28: 0.25 mg via INTRAVENOUS
  Administered 2017-05-28: 0.5 mg via INTRAVENOUS
  Administered 2017-05-28 (×3): 0.25 mg via INTRAVENOUS
  Administered 2017-05-28: 0.5 mg via INTRAVENOUS

## 2017-05-28 MED ORDER — ONDANSETRON HCL 4 MG/2ML IJ SOLN: 4.0000 mg | Freq: Once | INTRAMUSCULAR | Status: DC | PRN

## 2017-05-28 MED ORDER — ISOPROPYL ALCOHOL 70 % SOLN
Status: DC | PRN
  Administered 2017-05-28: 1 via TOPICAL

## 2017-05-28 MED ORDER — SODIUM CHLORIDE 0.9 % IR SOLN
Status: DC | PRN
  Administered 2017-05-28: 1000 mL

## 2017-05-28 MED ORDER — LACTATED RINGERS IV SOLN
INTRAVENOUS | Status: DC
Start: 2017-05-28 — End: 2017-05-28
  Administered 2017-05-28 (×2): via INTRAVENOUS

## 2017-05-28 MED ORDER — METHOCARBAMOL 500 MG PO TABS
500.0000 mg | ORAL_TABLET | Freq: Four times a day (QID) | ORAL | Status: DC | PRN
  Administered 2017-05-29: 05:00:00 500 mg via ORAL
  Filled 2017-05-28: qty 1

## 2017-05-28 MED ORDER — ACETAMINOPHEN 325 MG PO TABS: 650.0000 mg | ORAL_TABLET | Freq: Four times a day (QID) | ORAL | Status: DC | PRN

## 2017-05-28 MED ORDER — ROCURONIUM BROMIDE 100 MG/10ML IV SOLN: INTRAVENOUS | Status: DC | PRN

## 2017-05-28 SURGICAL SUPPLY — 34 items
BAG ZIPLOCK 12X15 (MISCELLANEOUS) IMPLANT
BIT DRILL CANN LG 4.3MM (BIT) ×1 IMPLANT
CHLORAPREP W/TINT 26ML (MISCELLANEOUS) ×3 IMPLANT
COVER PERINEAL POST (MISCELLANEOUS) ×3 IMPLANT
COVER SURGICAL LIGHT HANDLE (MISCELLANEOUS) ×3 IMPLANT
DERMABOND ADVANCED (GAUZE/BANDAGES/DRESSINGS) ×4
DERMABOND ADVANCED .7 DNX12 (GAUZE/BANDAGES/DRESSINGS) ×2 IMPLANT
DRAPE C-ARM 42X120 X-RAY (DRAPES) ×3 IMPLANT
DRAPE C-ARMOR (DRAPES) ×3 IMPLANT
DRAPE SHEET LG 3/4 BI-LAMINATE (DRAPES) ×3 IMPLANT
DRAPE STERI IOBAN 125X83 (DRAPES) ×3 IMPLANT
DRAPE U-SHAPE 47X51 STRL (DRAPES) ×6 IMPLANT
DRILL BIT CANN LG 4.3MM (BIT) ×3
DRSG MEPILEX BORDER 4X4 (GAUZE/BANDAGES/DRESSINGS) ×9 IMPLANT
DRSG MEPILEX BORDER 4X8 (GAUZE/BANDAGES/DRESSINGS) IMPLANT
ELECT BLADE TIP CTD 4 INCH (ELECTRODE) IMPLANT
FACESHIELD WRAPAROUND (MASK) ×3 IMPLANT
GAUZE SPONGE 4X4 12PLY STRL (GAUZE/BANDAGES/DRESSINGS) ×3 IMPLANT
GLOVE BIO SURGEON STRL SZ8.5 (GLOVE) ×6 IMPLANT
GLOVE BIOGEL PI IND STRL 8.5 (GLOVE) ×1 IMPLANT
GLOVE BIOGEL PI INDICATOR 8.5 (GLOVE) ×2
GOWN SPEC L3 XXLG W/TWL (GOWN DISPOSABLE) ×3 IMPLANT
GUIDEPIN 3.2X17.5 THRD DISP (PIN) ×3 IMPLANT
HIP FR NAIL LAG SCREW 10.5X110 (Orthopedic Implant) ×3 IMPLANT
KIT BASIN OR (CUSTOM PROCEDURE TRAY) ×3 IMPLANT
MANIFOLD NEPTUNE II (INSTRUMENTS) ×3 IMPLANT
MARKER SKIN DUAL TIP RULER LAB (MISCELLANEOUS) ×3 IMPLANT
NAIL HIP FRACT 130D 11X180 (Screw) ×3 IMPLANT
PACK TOTAL JOINT WL LF (CUSTOM PROCEDURE TRAY) ×3 IMPLANT
SCREW BONE CORTICAL 5.0X38 (Screw) ×3 IMPLANT
SCREW LAG HIP FR NAIL 10.5X110 (Orthopedic Implant) ×1 IMPLANT
SUT MNCRL AB 3-0 PS2 18 (SUTURE) ×3 IMPLANT
SUT VIC AB 1 CT1 36 (SUTURE) ×3 IMPLANT
YANKAUER SUCT BULB TIP NO VENT (SUCTIONS) IMPLANT

## 2017-05-28 NOTE — Discharge Instructions (Signed)
 Dr. Wyvonne Carda Adult Hip & Knee Specialist [facility] [address] [place], [place] [address] [phone]   POSTOPERATIVE DIRECTIONS    Hip Rehabilitation, Guidelines Following Surgery   WEIGHT BEARING Weight bearing as tolerated with assist device (walker, cane, etc) as directed, use it as long as suggested by your surgeon or therapist, typically at least 4-6 weeks.   HOME CARE INSTRUCTIONS  Remove items at home which could result in a fall. This includes throw rugs or furniture in walking pathways.  Continue medications as instructed at time of discharge.  You may have some home medications which will be placed on hold until you complete the course of blood thinner medication.  4 days after discharge, you may start showering. No tub baths or soaking your incisions. Do not put on socks or shoes without following the instructions of your caregivers.   Sit on chairs with arms. Use the chair arms to help push yourself up when arising.  Arrange for the use of a toilet seat elevator so you are not sitting low.   Walk with walker as instructed.  You may resume a sexual relationship in one month or when given the OK by your caregiver.  Use walker as long as suggested by your caregivers.  Avoid periods of inactivity such as sitting longer than an hour when not asleep. This helps prevent blood clots.  You may return to work once you are cleared by your surgeon.  Do not drive a car for 6 weeks or until released by your surgeon.  Do not drive while taking narcotics.  Wear elastic stockings for two weeks following surgery during the day but you may remove then at night.  Make sure you keep all of your appointments after your operation with all of your doctors and caregivers. You should call the office at the above phone number and make an appointment for approximately two weeks after the date of your surgery. Please pick up a stool softener and laxative  for home use as long as you are requiring pain medications.  ICE to the affected hip every three hours for 30 minutes at a time and then as needed for pain and swelling. Continue to use ice on the hip for pain and swelling from surgery. You may notice swelling that will progress down to the foot and ankle.  This is normal after surgery.  Elevate the leg when you are not up walking on it.   It is important for you to complete the blood thinner medication as prescribed by your doctor.  Continue to use the breathing machine which will help keep your temperature down.  It is common for your temperature to cycle up and down following surgery, especially at night when you are not up moving around and exerting yourself.  The breathing machine keeps your lungs expanded and your temperature down.  RANGE OF MOTION AND STRENGTHENING EXERCISES  These exercises are designed to help you keep full movement of your hip joint. Follow your caregiver's or physical therapist's instructions. Perform all exercises about fifteen times, three times per day or as directed. Exercise both hips, even if you have had only one joint replacement. These exercises can be done on a training (exercise) mat, on the floor, on a table or on a bed. Use whatever works the best and is most comfortable for you. Use music or television while you are exercising so that the exercises are a pleasant break in your day. This   will make your life better with the exercises acting as a break in routine you can look forward to.  Lying on your back, slowly slide your foot toward your buttocks, raising your knee up off the floor. Then slowly slide your foot back down until your leg is straight again.  Lying on your back spread your legs as far apart as you can without causing discomfort.  Lying on your side, raise your upper leg and foot straight up from the floor as far as is comfortable. Slowly lower the leg and repeat.  Lying on your back, tighten up the  muscle in the front of your thigh (quadriceps muscles). You can do this by keeping your leg straight and trying to raise your heel off the floor. This helps strengthen the largest muscle supporting your knee.  Lying on your back, tighten up the muscles of your buttocks both with the legs straight and with the knee bent at a comfortable angle while keeping your heel on the floor.   SKILLED REHAB INSTRUCTIONS: If the patient is transferred to a skilled rehab facility following release from the hospital, a list of the current medications will be sent to the facility for the patient to continue.  When discharged from the skilled rehab facility, please have the facility set up the patient's Home Health Physical Therapy prior to being released. Also, the skilled facility will be responsible for providing the patient with their medications at time of release from the facility to include their pain medication and their blood thinner medication. If the patient is still at the rehab facility at time of the two week follow up appointment, the skilled rehab facility will also need to assist the patient in arranging follow up appointment in our office and any transportation needs.  MAKE SURE YOU:  Understand these instructions.  Will watch your condition.  Will get help right away if you are not doing well or get worse.  Pick up stool softner and laxative for home use following surgery while on pain medications. Daily dry dressing changes as needed. In 4 days, you may remove your dressings and begin taking showers - no tub baths or soaking the incisions. Continue to use ice for pain and swelling after surgery. Do not use any lotions or creams on the incision until instructed by your surgeon.   

## 2017-05-28 NOTE — Anesthesia Postprocedure Evaluation (Signed)
Anesthesia Post Note  Patient: Eric Barker  Procedure(s) Performed: Procedure(s) (LRB): INTRAMEDULLARY (IM) NAIL INTERTROCHANTRIC RIGHT FEMUR (Right)     Patient location during evaluation: PACU Anesthesia Type: General Level of consciousness: awake Pain management: pain level controlled Vital Signs Assessment: post-procedure vital signs reviewed and stable Respiratory status: spontaneous breathing Cardiovascular status: stable Anesthetic complications: no    Last Vitals:  Vitals:   05/28/17 1310 05/28/17 1315  BP:  (!) 141/84  Pulse: 73 74  Resp: 14 12  Temp:    SpO2: 98% 99%    Last Pain:  Vitals:   05/28/17 1315  TempSrc:   PainSc: 7                  Lajune Perine

## 2017-05-28 NOTE — Anesthesia Preprocedure Evaluation (Signed)
Anesthesia Evaluation  Patient identified by MRN, date of birth, ID band Patient awake    Reviewed: Allergy & Precautions, NPO status , Patient's Chart, lab work & pertinent test results  Airway Mallampati: II  TM Distance: >3 FB     Dental   Pulmonary former smoker,    breath sounds clear to auscultation       Cardiovascular hypertension,  Rhythm:Regular Rate:Normal     Neuro/Psych    GI/Hepatic GERD  ,  Endo/Other    Renal/GU Renal disease     Musculoskeletal  (+) Arthritis ,   Abdominal   Peds  Hematology  (+) anemia ,   Anesthesia Other Findings   Reproductive/Obstetrics                             Anesthesia Physical Anesthesia Plan  ASA: III  Anesthesia Plan: General   Post-op Pain Management:    Induction: Intravenous  PONV Risk Score and Plan: 2 and Ondansetron and Dexamethasone  Airway Management Planned: Oral ETT  Additional Equipment:   Intra-op Plan:   Post-operative Plan: Possible Post-op intubation/ventilation  Informed Consent: I have reviewed the patients History and Physical, chart, labs and discussed the procedure including the risks, benefits and alternatives for the proposed anesthesia with the patient or authorized representative who has indicated his/her understanding and acceptance.   Dental advisory given  Plan Discussed with: CRNA and Anesthesiologist  Anesthesia Plan Comments:         Anesthesia Quick Evaluation

## 2017-05-28 NOTE — Op Note (Signed)
OPERATIVE REPORT  SURGEON: Rod Can, MD   ASSISTANT: Nehemiah Massed, PA-C.  PREOPERATIVE DIAGNOSIS: Right intertrochanteric femur fracture.   POSTOPERATIVE DIAGNOSIS: Right intertrochanteric femur fracture.   PROCEDURE: Intramedullary fixation, Right femur.   IMPLANTS: Biomet Affixus Hip Fracture Nail, 11 by 180 mm, 130 degrees. 10.5 mm x 110 mm Hip Fracture Nail Lag Screw. 5 x 38 mm distal interlocking screw 1.  ANESTHESIA:  General  ESTIMATED BLOOD LOSS: 50 mL.  ANTIBIOTICS: 2 g Ancef.  DRAINS: None.  COMPLICATIONS: None.   CONDITION: PACU - hemodynamically stable.Marland Kitchen   BRIEF CLINICAL NOTE: Eric Barker is a 83 y.o. male who presented with an intertrochanteric femur fracture. The patient was admitted to the hospitalist service and underwent perioperative risk stratification and medical optimization. The risks, benefits, and alternatives to the procedure were explained, and the patient elected to proceed.  PROCEDURE IN DETAIL: Surgical site was marked by myself. The patient was taken to the operating room and anesthesia was induced on the bed. The patient was then transferred to the [facility] table and the nonoperative lower extremity was scissored underneath the operative side. The fracture was reduced with traction, internal rotation, and adduction. The hip was prepped and draped in the normal sterile surgical fashion. Timeout was called verifying side and site of surgery. Preop antibiotics were given with 60 minutes of beginning the procedure.  Fluoroscopy was used to define the patient's anatomy. A 4 cm incision was made just proximal to the tip of the greater trochanter. The awl was used to obtain the standard starting point for a trochanteric entry nail under fluoroscopic control. The guidepin was placed. The entry reamer was used to open the proximal femur.  On the back table, the nail was assembled onto the jig. The nail was placed into the femur without any difficulty.  Through a separate stab incision, the cannula was placed down to the bone in preparation for the cephalomedullary device. A guidepin was placed into the femoral head using AP and lateral fluoroscopy views. The pin was measured, and then reaming was performed to the appropriate depth. The lag screw was inserted to the appropriate depth. The fracture was compressed through the jig. The setscrew was tightened and then loosened one quarter turn. A separate stab incision was created, and the distal interlocking screw was placed using standard AO technique. The jig was removed. Final AP and lateral fluoroscopy views were obtained to confirm fracture reduction and hardware placement. Tip apex distance was appropriate. There was no chondral penetration.  The wounds were copiously irrigated with saline. The wound was closed in layers with #1 Vicryl for the fascia, 2-0 Monocryl for the deep dermal layer, and 3-0 Monocryl subcuticular stitch. Glue was applied to the skin. Once the glue was fully hardened, sterile dressing was applied. The patient was then awakened from anesthesia and taken to the PACU in stable condition. Sponge needle and instrument counts were correct at the end of the case 2. There were no known complications.  We will admit the patient for overnight observation. Weightbearing status will be weightbearing as tolerated with a walker. We will begin ASA for DVT prophylaxis. The patient will work with physical therapy and undergo disposition planning.  Please note that a surgical assistant was a medical necessity for this procedure to perform it in a safe and expeditious manner. Assistant was necessary to provide appropriate retraction of vital neurovascular structures, to prevent femoral fracture, and to allow for anatomic placement of the implants.

## 2017-05-28 NOTE — Anesthesia Procedure Notes (Signed)
Procedure Name: Intubation Date/Time: 05/28/2017 11:12 AM Performed by: Lissa Morales Pre-anesthesia Checklist: Patient identified, Emergency Drugs available, Suction available and Patient being monitored Patient Re-evaluated:Patient Re-evaluated prior to induction Oxygen Delivery Method: Circle system utilized Preoxygenation: Pre-oxygenation with 100% oxygen Induction Type: IV induction Ventilation: Mask ventilation without difficulty Laryngoscope Size: Mac and 4 Grade View: Grade II Tube type: Oral Tube size: 7.5 mm Number of attempts: 1 Airway Equipment and Method: Stylet and Oral airway Placement Confirmation: ETT inserted through vocal cords under direct vision,  positive ETCO2 and breath sounds checked- equal and bilateral Secured at: 21 cm Tube secured with: Tape Dental Injury: Teeth and Oropharynx as per pre-operative assessment  Comments: Small mouth Increased cricoid

## 2017-05-28 NOTE — Transfer of Care (Signed)
Immediate Anesthesia Transfer of Care Note  Patient: Eric Barker  Procedure(s) Performed: Procedure(s) with comments: INTRAMEDULLARY (IM) NAIL INTERTROCHANTRIC RIGHT FEMUR (Right) - Needs RNFA  Patient Location: PACU  Anesthesia Type:General  Level of Consciousness:  sedated, patient cooperative and responds to stimulation  Airway & Oxygen Therapy:Patient Spontanous Breathing and Patient connected to face mask oxgen  Post-op Assessment:  Report given to PACU RN and Post -op Vital signs reviewed and stable  Post vital signs:  Reviewed and stable  Last Vitals:  Vitals:   05/28/17 0835  BP: (!) 145/77  Pulse: 61  Resp: 18  Temp: 36.8 C  SpO2: 408%    Complications: No apparent anesthesia complications

## 2017-05-28 NOTE — H&P (Signed)
ORTHOPAEDIC H&P  REQUESTING PHYSICIAN: Rod Can, MD  PCP:  Velna Hatchet, MD  Chief Complaint: right intertrochanteric femur fracture.  HPI: Eric Barker is a 83 y.o. male who complains of  Right hip pain after a fall on 05/18/2017. He had progressive pain over the next several days. He was seen in the office, and an MRI was obtained on 05/23/2017, revealing a comminuted, nondisplaced right intertrochanteric femur fracture. I saw him on the office this past Monday.We discussed the risks, benefits, and alternatives to intramedullary fixation of the intertrochanteric femur fracture. He wishes to proceed.  Past Medical History:  Diagnosis Date  . Barrett's esophagus 2012  . Cancer ([place])   . Chronic kidney disease (CKD), stage III (moderate)   . Colon polyps   . GERD (gastroesophageal reflux disease)   . Hemorrhoids   . Hyperlipidemia   . Hypertension   . Iron deficiency anemia   . Melanoma in situ of trunk ([place])   . Osteoarthritis    Past Surgical History:  Procedure Laterality Date  . APPENDECTOMY    . HERNIA REPAIR     Social History   Social History  . Marital status: Married    Spouse name: N/A  . Number of children: 2  . Years of education: N/A   Occupational History  . Retired    Social History Main Topics  . Smoking status: Former Smoker    Packs/day: 0.50    Types: Cigarettes    Quit date: 09/08/1998  . Smokeless tobacco: Never Used  . Alcohol use Yes     Comment: 1 oz per day  . Drug use: No  . Sexual activity: Not Asked   Other Topics Concern  . None   Social History Narrative  . None   Family History  Problem Relation Age of Onset  . Lung cancer Father   . Arthritis Father   . Breast cancer Mother   . Diabetes Mother   . Arthritis Mother   . Colon cancer Neg Hx   . Esophageal cancer Neg Hx   . Stomach cancer Neg Hx   . Rectal cancer Neg Hx    No Known Allergies Prior to Admission medications   Medication Sig Start Date  End Date Taking? Authorizing Provider  acetaminophen (TYLENOL) 650 MG CR tablet Take 650 mg by mouth daily.   Yes [provider]  amLODipine (NORVASC) 5 MG tablet Take 5 mg by mouth every evening. 05/05/17  Yes [provider]  celecoxib (CELEBREX) 200 MG capsule Take 200 mg by mouth daily.   Yes [provider]  docusate sodium (COLACE) 100 MG capsule Take 100 mg by mouth daily.   Yes [provider]  ezetimibe-simvastatin (VYTORIN) 10-20 MG per tablet Take 1 tablet by mouth daily.   Yes [provider]  HYDROcodone-acetaminophen (NORCO/VICODIN) 5-325 MG tablet Take 1 tablet by mouth 3 (three) times daily.   Yes [provider]  losartan (COZAAR) 50 MG tablet Take 50 mg by mouth daily.   Yes [provider]  hydrocortisone (PROCTOZONE-HC) 2.5 % rectal cream Place 1 application rectally 2 (two) times daily. Patient not taking: Reported on 05/25/2017 01/13/14   Willia Craze, NP  loperamide (IMODIUM A-D) 2 MG tablet Take 2 mg by mouth as needed for diarrhea or loose stools.    [provider]  omeprazole (PRILOSEC) 20 MG capsule Take 1 capsule (20 mg total) by mouth daily. Patient taking differently: Take 20 mg by mouth  daily as needed (upset stomach/ diarrhea).  01/06/14   Zehr, Laban Emperor, PA-C  Probiotic Product (RESTORA) CAPS Take 1 capsule by mouth daily. Patient not taking: Reported on 05/25/2017 08/12/13   Irene Shipper, MD   No results found.  Positive ROS: All other systems have been reviewed and were otherwise negative with the exception of those mentioned in the HPI and as above.  Physical Exam: General: Alert, no acute distress Cardiovascular: No pedal edema Respiratory: No cyanosis, no use of accessory musculature GI: No organomegaly, abdomen is soft and non-tender Skin: No lesions in the area of chief complaint Neurologic: Sensation intact distally Psychiatric: Patient is competent for consent with normal mood  and affect Lymphatic: No axillary or cervical lymphadenopathy  MUSCULOSKELETAL: examination the right lower extremity reveals no skin wounds or lesions. He does have some bruising over the right flank. He does have pain with logrolling of the hip. He is neurovascularly intact distally.  Assessment: Right intertrochanteric femur fracture.  Plan: We'll plan for intramedullary fixation of the right femur. Patient understands the risks, benefits, and alternatives. He wishes to proceed. Postoperatively, we will bring him in for overnight observation. He may weight-bear as tolerated with a walker. We'll have him work with physical therapy. He will discharge home in the morning.  The risks, benefits, and alternatives were discussed with the patient. There are risks associated with the surgery including, but not limited to, problems with anesthesia (death), infection, differences in leg length/angulation/rotation, fracture of bones, loosening or failure of implants, malunion, nonunion, hematoma (blood accumulation) which may require surgical drainage, blood clots, pulmonary embolism, nerve injury (foot drop), and blood vessel injury. The patient understands these risks and elects to proceed.    Zafir Schauer, Horald Pollen, MD Cell [phone]    05/28/2017 9:50 AM

## 2017-05-29 DIAGNOSIS — N183 Chronic kidney disease, stage 3 (moderate): Secondary | ICD-10-CM | POA: Diagnosis present

## 2017-05-29 DIAGNOSIS — Z87891 Personal history of nicotine dependence: Secondary | ICD-10-CM | POA: Diagnosis not present

## 2017-05-29 DIAGNOSIS — I129 Hypertensive chronic kidney disease with stage 1 through stage 4 chronic kidney disease, or unspecified chronic kidney disease: Secondary | ICD-10-CM | POA: Diagnosis present

## 2017-05-29 DIAGNOSIS — Z833 Family history of diabetes mellitus: Secondary | ICD-10-CM | POA: Diagnosis not present

## 2017-05-29 DIAGNOSIS — S72144A Nondisplaced intertrochanteric fracture of right femur, initial encounter for closed fracture: Secondary | ICD-10-CM | POA: Diagnosis present

## 2017-05-29 DIAGNOSIS — E785 Hyperlipidemia, unspecified: Secondary | ICD-10-CM | POA: Diagnosis present

## 2017-05-29 DIAGNOSIS — K219 Gastro-esophageal reflux disease without esophagitis: Secondary | ICD-10-CM | POA: Diagnosis present

## 2017-05-29 DIAGNOSIS — W19XXXA Unspecified fall, initial encounter: Secondary | ICD-10-CM | POA: Diagnosis present

## 2017-05-29 LAB — CBC
HEMATOCRIT: 30.8 % — AB (ref 39.0–52.0)
Hemoglobin: 10.3 g/dL — ABNORMAL LOW (ref 13.0–17.0)
MCH: 31.2 pg (ref 26.0–34.0)
MCHC: 33.4 g/dL (ref 30.0–36.0)
MCV: 93.3 fL (ref 78.0–100.0)
Platelets: 214 10*3/uL (ref 150–400)
RBC: 3.3 MIL/uL — ABNORMAL LOW (ref 4.22–5.81)
RDW: 12.7 % (ref 11.5–15.5)
WBC: 7.8 10*3/uL (ref 4.0–10.5)

## 2017-05-29 LAB — BASIC METABOLIC PANEL
ANION GAP: 8 (ref 5–15)
BUN: 24 mg/dL — ABNORMAL HIGH (ref 6–20)
CO2: 23 mmol/L (ref 22–32)
Calcium: 9 mg/dL (ref 8.9–10.3)
Chloride: 108 mmol/L (ref 101–111)
Creatinine, Ser: 1.37 mg/dL — ABNORMAL HIGH (ref 0.61–1.24)
GFR calc Af Amer: 55 mL/min — ABNORMAL LOW (ref >60)
GFR, EST NON AFRICAN AMERICAN: 47 mL/min — AB (ref >60)
GLUCOSE: 143 mg/dL — AB (ref 65–99)
POTASSIUM: 4.9 mmol/L (ref 3.5–5.1)
Sodium: 139 mmol/L (ref 135–145)

## 2017-05-29 MED ORDER — ASPIRIN 81 MG PO CHEW
81.0000 mg | CHEWABLE_TABLET | Freq: Two times a day (BID) | ORAL | Status: DC
  Administered 2017-05-29: 10:00:00 81 mg via ORAL
  Filled 2017-05-29: qty 1

## 2017-05-29 MED ORDER — HYDROCODONE-ACETAMINOPHEN 7.5-325 MG PO TABS
1.0000 | ORAL_TABLET | Freq: Four times a day (QID) | ORAL | Status: DC | PRN
  Administered 2017-05-29 (×2): 1 via ORAL
  Filled 2017-05-29: qty 2
  Filled 2017-05-29: qty 1

## 2017-05-29 MED ORDER — ASPIRIN 81 MG PO CHEW: 81.0000 mg | CHEWABLE_TABLET | Freq: Two times a day (BID) | ORAL | 1 refills | Status: DC

## 2017-05-29 MED ORDER — HYDROCODONE-ACETAMINOPHEN 7.5-325 MG PO TABS: 1.0000 | ORAL_TABLET | Freq: Four times a day (QID) | ORAL | 0 refills | Status: DC | PRN

## 2017-05-29 MED ORDER — ONDANSETRON HCL 4 MG PO TABS: 4.0000 mg | ORAL_TABLET | Freq: Four times a day (QID) | ORAL | 0 refills | Status: DC | PRN

## 2017-05-29 NOTE — Evaluation (Signed)
Physical Therapy Evaluation Patient Details Name: Eric Barker MRN: [id number] DOB: 13-May-1934 Today's Date: 05/29/2017   History of Present Illness  Eric Barker is a 83 y.o. male who complains of  Right hip pain after a fall on 05/18/2017. He had progressive pain over the next several days. He was seen in the office, and an MRI was obtained on 05/23/2017, revealing a comminuted, nondisplaced right intertrochanteric femur fracture.   Clinical Impression  Pt admitted as above and presenting with functional mobility limitations 2* decreased R LE strength/ROM and post op pain.  Pt should progress to dc home with family assist.    Follow Up Recommendations No PT follow up    Equipment Recommendations  None recommended by PT    Recommendations for Other Services OT consult     Precautions / Restrictions Precautions Precautions: None Restrictions Weight Bearing Restrictions: No Other Position/Activity Restrictions: WBAT      Mobility  Bed Mobility Overal bed mobility: Needs Assistance Bed Mobility: Supine to Sit     Supine to sit: Modified independent (Device/Increase time)     General bed mobility comments: Increased time and use of bed rail  Transfers Overall transfer level: Needs assistance Equipment used: Rolling walker (2 wheeled) Transfers: Sit to/from Stand Sit to Stand: Min assist;Min guard Stand pivot transfers: Supervision;Min guard       General transfer comment: cues for LE management and use of UEs to self assist  Ambulation/Gait Ambulation/Gait assistance: Min assist;Min guard Ambulation Distance (Feet): 120 Feet Assistive device: Rolling walker (2 wheeled) Gait Pattern/deviations: Step-to pattern;Step-through pattern;Decreased step length - right;Decreased step length - left;Shuffle;Trunk flexed Gait velocity: decr Gait velocity interpretation: Below normal speed for age/gender General Gait Details: cues for posture, position from RW and  initial sequence  Stairs            Wheelchair Mobility    Modified Rankin (Stroke Patients Only)       Balance Overall balance assessment: Needs assistance Sitting-balance support: No upper extremity supported;Feet supported Sitting balance-Leahy Scale: Good     Standing balance support: Bilateral upper extremity supported Standing balance-Leahy Scale: Poor                               Pertinent Vitals/Pain Pain Assessment: 0-10 Pain Score: 4  Pain Location: R thigh area Pain Descriptors / Indicators: Sore Pain Intervention(s): Limited activity within patient's tolerance;Monitored during session;Premedicated before session    Home Living Family/patient expects to be discharged to:: Private residence Living Arrangements: Spouse/significant other Available Help at Discharge: Family Type of Home: House Home Access: Stairs to enter   Technical brewer of Steps: 1 Home Layout: One level Home Equipment: Environmental consultant - 2 wheels;Walker - 4 wheels;Bedside commode;Cane - single point      Prior Function Level of Independence: Independent               Hand Dominance        Extremity/Trunk Assessment   Upper Extremity Assessment Upper Extremity Assessment: Overall WFL for tasks assessed    Lower Extremity Assessment Lower Extremity Assessment: RLE deficits/detail RLE Deficits / Details: 2+/5 strength with AAROM at hip to 85 flex and 15 abd    Cervical / Trunk Assessment Cervical / Trunk Assessment: Normal  Communication   Communication: No difficulties  Cognition Arousal/Alertness: Awake/alert Behavior During Therapy: WFL for tasks assessed/performed Overall Cognitive Status: Within Functional Limits for tasks assessed  General Comments      Exercises General Exercises - Lower Extremity Ankle Circles/Pumps: AROM;Both;15 reps;Supine Quad Sets: AROM;Both;10 reps;Supine Heel  Slides: AAROM;Right;20 reps;Supine Hip ABduction/ADduction: AAROM;Right;15 reps;Supine   Assessment/Plan    PT Assessment Patient needs continued PT services  PT Problem List Decreased strength;Decreased range of motion;Decreased activity tolerance;Decreased balance;Decreased mobility;Decreased knowledge of use of DME;Pain       PT Treatment Interventions DME instruction;Gait training;Stair training;Functional mobility training;Therapeutic activities;Therapeutic exercise;Patient/family education    PT Goals (Current goals can be found in the Care Plan section)  Acute Rehab PT Goals Patient Stated Goal: home PT Goal Formulation: With patient Time For Goal Achievement: 05/31/17 Potential to Achieve Goals: Good    Frequency Min 6X/week   Barriers to discharge        Co-evaluation               AM-PAC PT "6 Clicks" Daily Activity  Outcome Measure Difficulty turning over in bed (including adjusting bedclothes, sheets and blankets)?: A Lot Difficulty moving from lying on back to sitting on the side of the bed? : A Lot Difficulty sitting down on and standing up from a chair with arms (e.g., wheelchair, bedside commode, etc,.)?: A Lot Help needed moving to and from a bed to chair (including a wheelchair)?: A Little Help needed walking in hospital room?: A Little Help needed climbing 3-5 steps with a railing? : A Little 6 Click Score: 15    End of Session Equipment Utilized During Treatment: Gait belt Activity Tolerance: Patient tolerated treatment well;Patient limited by fatigue Patient left: in chair;with call bell/phone within reach Nurse Communication: Mobility status PT Visit Diagnosis: Unsteadiness on feet (R26.81);Difficulty in walking, not elsewhere classified (R26.2)    Time: 4765-4650 PT Time Calculation (min) (ACUTE ONLY): 35 min   Charges:   PT Evaluation $PT Eval Low Complexity: 1 Low PT Treatments $Gait Training: 8-22 mins   PT G Codes:   PT G-Codes  **NOT FOR INPATIENT CLASS** Functional Assessment Tool Used: Clinical judgement Functional Limitation: Mobility: Walking and moving around Mobility: Walking and Moving Around Current Status (P5465): At least 20 percent but less than 40 percent impaired, limited or restricted Mobility: Walking and Moving Around Goal Status [phone]): At least 1 percent but less than 20 percent impaired, limited or restricted    Pg [phone]   Karson Chicas 05/29/2017, 1:49 PM

## 2017-05-29 NOTE — Progress Notes (Signed)
   Subjective:  Patient reports pain as moderate.  C/o right hip pain. Denies N/V/CP/SOB.  Objective:   VITALS:   Vitals:   05/28/17 1748 05/28/17 2127 05/29/17 0106 05/29/17 0510  BP: 125/71 129/73 (!) 153/78 137/75  Pulse: 89 73 91 68  Resp: 14 15 16 15   Temp: 97.6 F (36.4 C) 98.3 F (36.8 C) 97.8 F (36.6 C) 98.5 F (36.9 C)  TempSrc: Oral Oral Oral Oral  SpO2: 96% 100% 98% 97%  Weight:      Height:        NAD ABD soft Sensation intact distally Intact pulses distally Dorsiflexion/Plantar flexion intact Incision: dressing C/D/I Compartment soft   Lab Results  Component Value Date   WBC 7.8 05/29/2017   HGB 10.3 (L) 05/29/2017   HCT 30.8 (L) 05/29/2017   MCV 93.3 05/29/2017   PLT 214 05/29/2017   BMET    Component Value Date/Time   NA 139 05/29/2017 0512   K 4.9 05/29/2017 0512   CL 108 05/29/2017 0512   CO2 23 05/29/2017 0512   GLUCOSE 143 (H) 05/29/2017 0512   BUN 24 (H) 05/29/2017 0512   CREATININE 1.37 (H) 05/29/2017 0512   CALCIUM 9.0 05/29/2017 0512   GFRNONAA 47 (L) 05/29/2017 0512   GFRAA 55 (L) 05/29/2017 0512     Assessment/Plan: 1 Day Post-Op   Active Problems:   Closed comminuted intertrochanteric fracture of proximal end of right femur ([place])   WBAT with walker DVT ppx: ASA, SCDs, TEDs PO pain control: will change Norco 5 to 7.5 PT/OT Dispo: D/C home today after clears therapy   Bruk Tumolo, Horald Pollen 05/29/2017, 7:54 AM   Rod Can, MD Cell [phone]

## 2017-05-29 NOTE — Discharge Summary (Signed)
Physician Discharge Summary  Patient ID: Eric Barker MRN: [id number] DOB/AGE: May 17, 1934 83 y.o.  Admit date: 05/28/2017 Discharge date: 05/29/2017  Admission Diagnoses:  Closed comminuted intertrochanteric fracture of proximal end of right femur [facility])  Discharge Diagnoses:  Principal Problem:   Closed comminuted intertrochanteric fracture of proximal end of right femur [facility])   Past Medical History:  Diagnosis Date  . Barrett's esophagus 2012  . Cancer ([place])   . Chronic kidney disease (CKD), stage III (moderate)   . Colon polyps   . GERD (gastroesophageal reflux disease)   . Hemorrhoids   . Hyperlipidemia   . Hypertension   . Iron deficiency anemia   . Melanoma in situ of trunk ([place])   . Osteoarthritis     Surgeries: Procedure(s): INTRAMEDULLARY (IM) NAIL INTERTROCHANTRIC RIGHT FEMUR on 05/28/2017   Consultants (if any):   Discharged Condition: Improved  Hospital Course: Eric Barker is an 83 y.o. male who was admitted 05/28/2017 with a diagnosis of Closed comminuted intertrochanteric fracture of proximal end of right femur ([place]) and went to the operating room on 05/28/2017 and underwent the above named procedures.    He was given perioperative antibiotics:  Anti-infectives    Start     Dose/Rate Route Frequency Ordered Stop   05/28/17 1800  ceFAZolin (ANCEF) IVPB 2g/100 mL premix     2 g 200 mL/hr over 30 Minutes Intravenous Every 6 hours 05/28/17 1427 05/29/17 0030   05/28/17 0834  ceFAZolin (ANCEF) IVPB 2g/100 mL premix     2 g 200 mL/hr over 30 Minutes Intravenous On call to O.R. 05/28/17 4128 05/28/17 1137    .  He was given sequential compression devices, early ambulation, and ASA for DVT prophylaxis.  He benefited maximally from the hospital stay and there were no complications.    Recent vital signs:  Vitals:   05/29/17 0510 05/29/17 1034  BP: 137/75 124/76  Pulse: 68 64  Resp: 15 16  Temp: 98.5 F (36.9 C) 98 F (36.7 C)  SpO2: 97% 98%     Recent laboratory studies:  Lab Results  Component Value Date   HGB 10.3 (L) 05/29/2017   HGB 11.4 (L) 05/27/2017   HGB 12.0 (L) 01/13/2014   Lab Results  Component Value Date   WBC 7.8 05/29/2017   PLT 214 05/29/2017   No results found for: INR Lab Results  Component Value Date   NA 139 05/29/2017   K 4.9 05/29/2017   CL 108 05/29/2017   CO2 23 05/29/2017   BUN 24 (H) 05/29/2017   CREATININE 1.37 (H) 05/29/2017   GLUCOSE 143 (H) 05/29/2017    Discharge Medications:   Allergies as of 05/29/2017   No Known Allergies     Medication List    STOP taking these medications   acetaminophen 650 MG CR tablet Commonly known as:  TYLENOL   HYDROcodone-acetaminophen 5-325 MG tablet Commonly known as:  NORCO/VICODIN Replaced by:  HYDROcodone-acetaminophen 7.5-325 MG tablet     TAKE these medications   amLODipine 5 MG tablet Commonly known as:  NORVASC Take 5 mg by mouth every evening.   aspirin 81 MG chewable tablet Chew 1 tablet (81 mg total) by mouth 2 (two) times daily with a meal.   celecoxib 200 MG capsule Commonly known as:  CELEBREX Take 200 mg by mouth daily.   docusate sodium 100 MG capsule Commonly known as:  COLACE Take 100 mg by mouth daily.   ezetimibe-simvastatin 10-20 MG tablet Commonly known as:  VYTORIN Take 1 tablet by mouth daily.   HYDROcodone-acetaminophen 7.5-325 MG tablet Commonly known as:  NORCO Take 1-2 tablets by mouth every 6 (six) hours as needed for severe pain. Replaces:  HYDROcodone-acetaminophen 5-325 MG tablet   hydrocortisone 2.5 % rectal cream Commonly known as:  PROCTOZONE-HC Place 1 application rectally 2 (two) times daily.   loperamide 2 MG tablet Commonly known as:  IMODIUM A-D Take 2 mg by mouth as needed for diarrhea or loose stools.   losartan 50 MG tablet Commonly known as:  COZAAR Take 50 mg by mouth daily.   omeprazole 20 MG capsule Commonly known as:  PRILOSEC Take 1 capsule (20 mg total) by mouth  daily. What changed:  when to take this  reasons to take this   ondansetron 4 MG tablet Commonly known as:  ZOFRAN Take 1 tablet (4 mg total) by mouth every 6 (six) hours as needed for nausea.   RESTORA Caps Take 1 capsule by mouth daily.            Discharge Care Instructions        Start     Ordered   05/29/17 0000  aspirin 81 MG chewable tablet  2 times daily with meals     05/29/17 0758   05/29/17 0000  HYDROcodone-acetaminophen (NORCO) 7.5-325 MG tablet  Every 6 hours PRN     05/29/17 0758   05/29/17 0000  ondansetron (ZOFRAN) 4 MG tablet  Every 6 hours PRN     05/29/17 0758   05/29/17 0000  Call MD / Call 911    Comments:  If you experience chest pain or shortness of breath, CALL 911 and be transported to the hospital emergency room.  If you develope a fever above 101 F, pus (white drainage) or increased drainage or redness at the wound, or calf pain, call your surgeon's office.   05/29/17 0758   05/29/17 0000  Diet - low sodium heart healthy     05/29/17 0758   05/29/17 0000  Constipation Prevention    Comments:  Drink plenty of fluids.  Prune juice may be helpful.  You may use a stool softener, such as Colace (over the counter) 100 mg twice a day.  Use MiraLax (over the counter) for constipation as needed.   05/29/17 0758   05/29/17 0000  Increase activity slowly as tolerated     05/29/17 0758   05/29/17 0000  Driving restrictions    Comments:  No driving for 6 weeks   05/29/17 0758   05/29/17 0000  Lifting restrictions    Comments:  No lifting for 6 weeks   05/29/17 0758   05/29/17 0000  Do not sit on low chairs, stoools or toilet seats, as it may be difficult to get up from low surfaces     05/29/17 0758      Diagnostic Studies: Pelvis Portable  Result Date: 05/28/2017 CLINICAL DATA:  Total hip replacement. EXAM: PORTABLE PELVIS 1-2 VIEWS COMPARISON:  No recent . FINDINGS: Degenerative changes lumbar spine and left hip. No acute bony abnormality. ORIF  right hip. IMPRESSION: Degenerative changes lumbar spine and both hips. ORIF right hip. No acute bony abnormality. Electronically Signed   By: Marcello Moores  Register   On: 05/28/2017 12:54   Dg C-arm 1-60 Min-no Report  Result Date: 05/28/2017 Fluoroscopy was utilized by the requesting physician.  No radiographic interpretation.   Dg Hip Operative Unilat W Or W/o Pelvis Right  Result Date: 05/28/2017 CLINICAL DATA:  Intertrochanteric  fracture. EXAM: OPERATIVE RIGHT HIP WITH PELVIS; DG C-ARM 1-60 MIN-NO REPORT COMPARISON:  None. FLUOROSCOPY TIME:  Fluoroscopy Time:  1 minutes, 26 seconds. Radiation Exposure Index (if provided by the fluoroscopic device): 11.95 mGy. FINDINGS: Intraoperative x-rays demonstrate fixation of an intertrochanteric right femur fracture with a short cephalomedullary rod. No evidence of hardware complication. Alignment is anatomic. IMPRESSION: Right intertrochanteric femur fracture ORIF without evidence of hardware complication. Electronically Signed   By: Titus Dubin M.D.   On: 05/28/2017 13:14    Disposition: 01-Home or Self Care  Discharge Instructions    Call MD / Call 911    Complete by:  As directed    If you experience chest pain or shortness of breath, CALL 911 and be transported to the hospital emergency room.  If you develope a fever above 101 F, pus (white drainage) or increased drainage or redness at the wound, or calf pain, call your surgeon's office.   Constipation Prevention    Complete by:  As directed    Drink plenty of fluids.  Prune juice may be helpful.  You may use a stool softener, such as Colace (over the counter) 100 mg twice a day.  Use MiraLax (over the counter) for constipation as needed.   Diet - low sodium heart healthy    Complete by:  As directed    Do not sit on low chairs, stoools or toilet seats, as it may be difficult to get up from low surfaces    Complete by:  As directed    Driving restrictions    Complete by:  As directed    No  driving for 6 weeks   Increase activity slowly as tolerated    Complete by:  As directed    Lifting restrictions    Complete by:  As directed    No lifting for 6 weeks      Follow-up Information    Tamitha Norell, Aaron Edelman, MD. Schedule an appointment as soon as possible for a visit in 2 weeks.   Specialty:  Orthopedic Surgery Why:  For wound re-check Contact information: [address] [address] [phone]            Signed: Elie Goody 05/29/2017, 4:37 PM

## 2017-05-29 NOTE — Evaluation (Signed)
Occupational Therapy Evaluation Patient Details Name: Eric Barker MRN: [id number] DOB: 10/05/1933 Today's Date: 05/29/2017    History of Present Illness Eric Barker is a 83 y.o. male who complains of  Right hip pain after a fall on 05/18/2017. He had progressive pain over the next several days. He was seen in the office, and an MRI was obtained on 05/23/2017, revealing a comminuted, nondisplaced right intertrochanteric femur fracture.  Pt underwent internal fixation 9/21   Clinical Impression   OT education complete    Follow Up Recommendations  No OT follow up    Equipment Recommendations  None recommended by OT    Recommendations for Other Services       Precautions / Restrictions Precautions Precautions: None Restrictions Weight Bearing Restrictions: No      Mobility Bed Mobility               General bed mobility comments: pt in chair  Transfers Overall transfer level: Needs assistance Equipment used: Rolling walker (2 wheeled) Transfers: Sit to/from Stand;Stand Pivot Transfers Sit to Stand: Supervision;Min guard Stand pivot transfers: Supervision;Min guard       General transfer comment: VC for safety         ADL either performed or assessed with clinical judgement   ADL Overall ADL's : Needs assistance/impaired Eating/Feeding: Set up;Sitting   Grooming: Standing;Supervision/safety   Upper Body Bathing: Set up;Sitting   Lower Body Bathing: Set up;Sit to/from stand;Cueing for safety;Cueing for sequencing   Upper Body Dressing : Set up   Lower Body Dressing: Set up;Sit to/from stand;Cueing for safety;Cueing for sequencing   Toilet Transfer: Min guard;RW;Ambulation;Comfort height toilet   Toileting- Clothing Manipulation and Hygiene: Supervision/safety;Sit to/from stand;Cueing for safety;Cueing for sequencing     Tub/Shower Transfer Details (indicate cue type and reason): verbalized safety Functional mobility during ADLs: Cueing  for safety;Min guard;Rolling walker;Cueing for sequencing General ADL Comments: wife will A as needed     Vision Patient Visual Report: No change from baseline              Pertinent Vitals/Pain Pain Assessment: 0-10 Pain Score: 4  Pain Location: R thigh area Pain Descriptors / Indicators: Sore Pain Intervention(s): Limited activity within patient's tolerance;Ice applied;Monitored during session        Extremity/Trunk Assessment Upper Extremity Assessment Upper Extremity Assessment: Overall WFL for tasks assessed           Communication Communication Communication: No difficulties   Cognition Arousal/Alertness: Awake/alert Behavior During Therapy: WFL for tasks assessed/performed Overall Cognitive Status: Within Functional Limits for tasks assessed                                                Home Living Family/patient expects to be discharged to:: Private residence Living Arrangements: Spouse/significant other Available Help at Discharge: Family         Home Layout: One level     Bathroom Shower/Tub: Tub/shower unit;Walk-in shower   Bathroom Toilet: Handicapped height     Home Equipment: Bedside commode          Prior Functioning/Environment Level of Independence: Independent                          OT Goals(Current goals can be found in the care plan section) Acute Rehab OT Goals Patient Stated Goal:  home OT Goal Formulation: With patient  OT Frequency:                AM-PAC PT "6 Clicks" Daily Activity     Outcome Measure Help from another person eating meals?: None Help from another person taking care of personal grooming?: None Help from another person toileting, which includes using toliet, bedpan, or urinal?: A Little Help from another person bathing (including washing, rinsing, drying)?: A Little Help from another person to put on and taking off regular upper body clothing?: None Help from another  person to put on and taking off regular lower body clothing?: A Little 6 Click Score: 21   End of Session Equipment Utilized During Treatment: Rolling walker Nurse Communication: Mobility status  Activity Tolerance: Patient tolerated treatment well Patient left: in chair;with call bell/phone within reach  OT Visit Diagnosis: History of falling (Z91.81)                Time: 1050-1106 OT Time Calculation (min): 16 min Charges:  OT General Charges $OT Visit: 1 Visit OT Evaluation $OT Eval Low Complexity: 1 Low G-Codes: OT G-codes **NOT FOR INPATIENT CLASS** Functional Assessment Tool Used: Clinical judgement Functional Limitation: Self care Self Care Current Status (E0100): At least 20 percent but less than 40 percent impaired, limited or restricted Self Care Goal Status (F1219): At least 1 percent but less than 20 percent impaired, limited or restricted Self Care Discharge Status [phone]): At least 20 percent but less than 40 percent impaired, limited or restricted   Kari Baars, [place]  Payton Mccallum D 05/29/2017, 11:34 AM

## 2017-05-29 NOTE — Progress Notes (Signed)
Physical Therapy Treatment Patient Details Name: Eric Barker MRN: [id number] DOB: [birth year] Today's Date: 05/29/2017    History of Present Illness Eric Barker is a 83 y.o. male who complains of  Right hip pain after a fall on 05/18/2017. He had progressive pain over the next several days. He was seen in the office, and an MRI was obtained on 05/23/2017, revealing a comminuted, nondisplaced right intertrochanteric femur fracture.     PT Comments    Pt with increased stability and confidence with movement.  Spouse present to review stairs.   Follow Up Recommendations  No PT follow up     Equipment Recommendations  None recommended by PT    Recommendations for Other Services OT consult     Precautions / Restrictions Precautions Precautions: None Restrictions Weight Bearing Restrictions: No Other Position/Activity Restrictions: WBAT    Mobility  Bed Mobility Overal bed mobility: Needs Assistance Bed Mobility: Supine to Sit     Supine to sit: Modified independent (Device/Increase time)     General bed mobility comments: Pt OOB and declines back into bed  Transfers Overall transfer level: Needs assistance Equipment used: Rolling walker (2 wheeled) Transfers: Sit to/from Stand Sit to Stand: Supervision Stand pivot transfers: Supervision;Min guard       General transfer comment: cues for LE management and use of UEs to self assist  Ambulation/Gait Ambulation/Gait assistance: Min guard;Supervision Ambulation Distance (Feet): 160 Feet Assistive device: Rolling walker (2 wheeled) Gait Pattern/deviations: Step-to pattern;Step-through pattern;Decreased step length - right;Decreased step length - left;Shuffle;Trunk flexed Gait velocity: decr Gait velocity interpretation: Below normal speed for age/gender General Gait Details: cues for posture, position from RW    Stairs Stairs: Yes   Stair Management: No rails;Step to pattern;Forwards;With  walker Number of Stairs: 3 General stair comments: single step x 3 with cues for sequence and foot/RW placement.  Spouse present  Wheelchair Mobility    Modified Rankin (Stroke Patients Only)       Balance Overall balance assessment: Needs assistance Sitting-balance support: No upper extremity supported;Feet supported Sitting balance-Leahy Scale: Good     Standing balance support: Bilateral upper extremity supported Standing balance-Leahy Scale: Fair                              Cognition Arousal/Alertness: Awake/alert Behavior During Therapy: WFL for tasks assessed/performed Overall Cognitive Status: Within Functional Limits for tasks assessed                                        Exercises General Exercises - Lower Extremity Ankle Circles/Pumps: AROM;Both;15 reps;Supine Quad Sets: AROM;Both;10 reps;Supine Heel Slides: AAROM;Right;20 reps;Supine Hip ABduction/ADduction: AAROM;Right;15 reps;Supine    General Comments        Pertinent Vitals/Pain Pain Assessment: 0-10 Pain Score: 4  Pain Location: R thigh area Pain Descriptors / Indicators: Sore Pain Intervention(s): Limited activity within patient's tolerance;Monitored during session;Premedicated before session    Home Living Family/patient expects to be discharged to:: Private residence Living Arrangements: Spouse/significant other Available Help at Discharge: Family Type of Home: House Home Access: Stairs to enter   Home Layout: One level Home Equipment: Environmental consultant - 2 wheels;Walker - 4 wheels;Bedside commode;Cane - single point      Prior Function Level of Independence: Independent          PT Goals (current goals can now be found  in the care plan section) Acute Rehab PT Goals Patient Stated Goal: home PT Goal Formulation: With patient Time For Goal Achievement: 05/31/17 Potential to Achieve Goals: Good Progress towards PT goals: Progressing toward goals     Frequency    Min 6X/week      PT Plan Current plan remains appropriate    Co-evaluation              AM-PAC PT "6 Clicks" Daily Activity  Outcome Measure  Difficulty turning over in bed (including adjusting bedclothes, sheets and blankets)?: A Lot Difficulty moving from lying on back to sitting on the side of the bed? : A Lot Difficulty sitting down on and standing up from a chair with arms (e.g., wheelchair, bedside commode, etc,.)?: A Little Help needed moving to and from a bed to chair (including a wheelchair)?: A Little Help needed walking in hospital room?: A Little Help needed climbing 3-5 steps with a railing? : A Little 6 Click Score: 16    End of Session Equipment Utilized During Treatment: Gait belt Activity Tolerance: Patient tolerated treatment well Patient left: in chair;with call bell/phone within reach Nurse Communication: Mobility status PT Visit Diagnosis: Unsteadiness on feet (R26.81);Difficulty in walking, not elsewhere classified (R26.2)     Time: 2876-8115 PT Time Calculation (min) (ACUTE ONLY): 18 min  Charges:  $Gait Training: 8-22 mins                    G Codes:  Functional Assessment Tool Used: Clinical judgement Functional Limitation: Mobility: Walking and moving around Mobility: Walking and Moving Around Current Status (B2620): At least 20 percent but less than 40 percent impaired, limited or restricted Mobility: Walking and Moving Around Goal Status [phone]): At least 1 percent but less than 20 percent impaired, limited or restricted    Pg [phone]    Olyvia Gopal 05/29/2017, 1:56 PM

## 2017-06-12 DIAGNOSIS — S72144G Nondisplaced intertrochanteric fracture of right femur, subsequent encounter for closed fracture with delayed healing: Secondary | ICD-10-CM | POA: Diagnosis not present

## 2017-06-12 DIAGNOSIS — Z4789 Encounter for other orthopedic aftercare: Secondary | ICD-10-CM | POA: Diagnosis not present

## 2017-06-16 DIAGNOSIS — N183 Chronic kidney disease, stage 3 (moderate): Secondary | ICD-10-CM | POA: Diagnosis not present

## 2017-06-16 DIAGNOSIS — E7849 Other hyperlipidemia: Secondary | ICD-10-CM | POA: Diagnosis not present

## 2017-06-16 DIAGNOSIS — Z125 Encounter for screening for malignant neoplasm of prostate: Secondary | ICD-10-CM | POA: Diagnosis not present

## 2017-06-16 DIAGNOSIS — Z0000 Encounter for general adult medical examination without abnormal findings: Secondary | ICD-10-CM | POA: Diagnosis not present

## 2017-06-23 DIAGNOSIS — Z23 Encounter for immunization: Secondary | ICD-10-CM | POA: Diagnosis not present

## 2017-06-23 DIAGNOSIS — R351 Nocturia: Secondary | ICD-10-CM | POA: Diagnosis not present

## 2017-06-23 DIAGNOSIS — Z6822 Body mass index (BMI) 22.0-22.9, adult: Secondary | ICD-10-CM | POA: Diagnosis not present

## 2017-06-23 DIAGNOSIS — Z85820 Personal history of malignant melanoma of skin: Secondary | ICD-10-CM | POA: Diagnosis not present

## 2017-06-23 DIAGNOSIS — R238 Other skin changes: Secondary | ICD-10-CM | POA: Diagnosis not present

## 2017-06-23 DIAGNOSIS — N183 Chronic kidney disease, stage 3 (moderate): Secondary | ICD-10-CM | POA: Diagnosis not present

## 2017-06-23 DIAGNOSIS — Z0000 Encounter for general adult medical examination without abnormal findings: Secondary | ICD-10-CM | POA: Diagnosis not present

## 2017-06-23 DIAGNOSIS — R808 Other proteinuria: Secondary | ICD-10-CM | POA: Diagnosis not present

## 2017-06-23 DIAGNOSIS — Z1389 Encounter for screening for other disorder: Secondary | ICD-10-CM | POA: Diagnosis not present

## 2017-06-23 DIAGNOSIS — Z8719 Personal history of other diseases of the digestive system: Secondary | ICD-10-CM | POA: Diagnosis not present

## 2017-06-23 DIAGNOSIS — I10 Essential (primary) hypertension: Secondary | ICD-10-CM | POA: Diagnosis not present

## 2017-06-23 DIAGNOSIS — R634 Abnormal weight loss: Secondary | ICD-10-CM | POA: Diagnosis not present

## 2017-06-23 DIAGNOSIS — E7849 Other hyperlipidemia: Secondary | ICD-10-CM | POA: Diagnosis not present

## 2017-06-24 DIAGNOSIS — Z1212 Encounter for screening for malignant neoplasm of rectum: Secondary | ICD-10-CM | POA: Diagnosis not present

## 2017-07-03 ENCOUNTER — Encounter (INDEPENDENT_AMBULATORY_CARE_PROVIDER_SITE_OTHER): Payer: Self-pay

## 2017-07-03 ENCOUNTER — Ambulatory Visit (INDEPENDENT_AMBULATORY_CARE_PROVIDER_SITE_OTHER): Payer: Medicare Other | Admitting: Gastroenterology

## 2017-07-03 ENCOUNTER — Encounter: Payer: Self-pay | Admitting: Gastroenterology

## 2017-07-03 VITALS — BP 134/70 | HR 64 | Ht 70.0 in | Wt 157.2 lb

## 2017-07-03 DIAGNOSIS — Z87891 Personal history of nicotine dependence: Secondary | ICD-10-CM | POA: Insufficient documentation

## 2017-07-03 DIAGNOSIS — R634 Abnormal weight loss: Secondary | ICD-10-CM | POA: Diagnosis not present

## 2017-07-03 DIAGNOSIS — Z122 Encounter for screening for malignant neoplasm of respiratory organs: Secondary | ICD-10-CM | POA: Diagnosis not present

## 2017-07-03 DIAGNOSIS — R630 Anorexia: Secondary | ICD-10-CM | POA: Diagnosis not present

## 2017-07-03 NOTE — Patient Instructions (Signed)
You have been scheduled for a CT scan of the chest, abdomen and pelvis at [facility] ([address]---this is in the same building as [facility]).   You are scheduled on 07/09/17 at 2 pm. You should arrive 15 minutes prior to your appointment time for registration. Please follow the written instructions below on the day of your exam:  WARNING: IF YOU ARE ALLERGIC TO IODINE/X-RAY DYE, PLEASE NOTIFY RADIOLOGY IMMEDIATELY AT [phone]! YOU WILL BE GIVEN A 13 HOUR PREMEDICATION PREP.  1) Do not eat or drink anything after 10 am (4 hours prior to your test) 2) You have been given 2 bottles of oral contrast to drink. The solution may taste  better if refrigerated, but do NOT add ice or any other liquid to this solution. Shake well before drinking.    Drink 1 bottle of contrast @ 12 pm (2 hours prior to your exam)  Drink 1 bottle of contrast @ 1 pm (1 hour prior to your exam)  You may take any medications as prescribed with a small amount of water except for the following: Metformin, Glucophage, Glucovance, Avandamet, Riomet, Fortamet, Actoplus Met, Janumet, Glumetza or Metaglip. The above medications must be held the day of the exam AND 48 hours after the exam.  The purpose of you drinking the oral contrast is to aid in the visualization of your intestinal tract. The contrast solution may cause some diarrhea. Before your exam is started, you will be given a small amount of fluid to drink. Depending on your individual set of symptoms, you may also receive an intravenous injection of x-ray contrast/dye. Plan on being at [facility] for 30 minutes or longer, depending on the type of exam you are having performed.  This test typically takes 30-45 minutes to complete.  If you have any questions regarding your exam or if you need to reschedule, you may call the CT department at [phone] between the hours of 8:00 am and 5:00 pm,  Monday-Friday.  ________________________________________________________________________

## 2017-07-03 NOTE — H&P (View-Only) (Signed)
07/03/2017 Eric Barker [id number] 1934/02/07   HISTORY OF PRESENT ILLNESS: This is a pleasant 83 year old male who is known to Dr. Henrene Pastor.  He has history of Barrett's esophagus, but his last EGD in June 2015 was normal with no evidence of such.  His last colonoscopy was in July 2012 at a different facility at which time he was found to have internal hemorrhoids, diverticulosis, and one polyp that was removed and was a tubular adenoma.  He presents to the office today at the request of his PCP, Dr. Ardeth Perfect, for evaluation regarding poor appetite and weight loss.  The patient is here today with his wife.  They report about a 15 pound weight loss over the past year.  He says that he just does not have a good appetite and just never feels hungry for anything  He denies any problems swallowing, any dark or bloody stools, abdominal pain, nausea or vomiting.  His wife tells me that he was recently started on mirtazapine 15 mg at bedtime for the past couple weeks and she has already noticed increase in his appetite since that time.  She says that he has actually said a few times that he is hungry, which has been very unusual for him over the past year.  Recent CMP is normal except a mildly elevated BUN at 29.  CBC shows a mild anemia at 11.2 g.  MCV is normal at 96.  TSH is normal at 3.28.   Past Medical History:  Diagnosis Date  . Barrett's esophagus 2012  . Cancer ([place])   . Chronic kidney disease (CKD), stage III (moderate) (HCC)   . Colon polyps   . GERD (gastroesophageal reflux disease)   . Hemorrhoids   . Hyperlipidemia   . Hypertension   . Iron deficiency anemia   . Melanoma in situ of trunk ([place])   . Osteoarthritis    Past Surgical History:  Procedure Laterality Date  . APPENDECTOMY    . HERNIA REPAIR    . INTRAMEDULLARY (IM) NAIL INTERTROCHANTERIC Right 05/28/2017   Procedure: INTRAMEDULLARY (IM) NAIL INTERTROCHANTRIC RIGHT FEMUR;  Surgeon: Rod Can, MD;  Location:  [facility];  Service: Orthopedics;  Laterality: Right;  Needs RNFA    reports that he quit smoking about 18 years ago. His smoking use included Cigarettes. He smoked 0.50 packs per day. He has never used smokeless tobacco. He reports that he drinks alcohol. He reports that he does not use drugs. family history includes Arthritis in his father and mother; Breast cancer in his mother; Diabetes in his mother; Lung cancer in his father. No Known Allergies    Outpatient Encounter Prescriptions as of 07/03/2017  Medication Sig  . amLODipine (NORVASC) 5 MG tablet Take 5 mg by mouth every evening.  Marland Kitchen aspirin 81 MG chewable tablet Chew 1 tablet (81 mg total) by mouth 2 (two) times daily with a meal.  . celecoxib (CELEBREX) 200 MG capsule Take 200 mg by mouth daily.  Marland Kitchen docusate sodium (COLACE) 100 MG capsule Take 100 mg by mouth daily.  Marland Kitchen ezetimibe-simvastatin (VYTORIN) 10-20 MG per tablet Take 1 tablet by mouth daily.  Marland Kitchen HYDROcodone-acetaminophen (NORCO) 7.5-325 MG tablet Take 1-2 tablets by mouth every 6 (six) hours as needed for severe pain.  Marland Kitchen loperamide (IMODIUM A-D) 2 MG tablet Take 2 mg by mouth as needed for diarrhea or loose stools.  Marland Kitchen losartan (COZAAR) 50 MG tablet Take 50 mg by mouth daily.  . mirtazapine (REMERON) 15 MG  tablet Take 15 mg by mouth at bedtime.  Marland Kitchen omeprazole (PRILOSEC) 20 MG capsule Take 1 capsule (20 mg total) by mouth daily. (Patient taking differently: Take 20 mg by mouth daily as needed (upset stomach/ diarrhea). )  . [DISCONTINUED] hydrocortisone (PROCTOZONE-HC) 2.5 % rectal cream Place 1 application rectally 2 (two) times daily. (Patient not taking: Reported on 05/25/2017)  . [DISCONTINUED] ondansetron (ZOFRAN) 4 MG tablet Take 1 tablet (4 mg total) by mouth every 6 (six) hours as needed for nausea.  . [DISCONTINUED] Probiotic Product (RESTORA) CAPS Take 1 capsule by mouth daily. (Patient not taking: Reported on 05/25/2017)   No facility-administered encounter medications on  file as of 07/03/2017.      REVIEW OF SYSTEMS  : All other systems reviewed and negative except where noted in the History of Present Illness.   PHYSICAL EXAM: BP 134/70   Pulse 64   Ht 5\' 10"  (1.778 m)   Wt 157 lb 3.2 oz (71.3 kg)   BMI 22.56 kg/m  General: Well developed white male in no acute distress Head: Normocephalic and atraumatic Eyes:  Sclerae anicteric, conjunctiva pink. Ears: Normal auditory acuity Lungs: Clear throughout to auscultation; no increased WOB. Heart: Regular rate and rhythm; no M/R/G. Abdomen: Soft, non-distended.  BS present.  Non-tender. Musculoskeletal: Symmetrical with no gross deformities  Skin: No lesions on visible extremities Extremities: No edema  Neurological: Alert oriented x 4, grossly non-focal Psychological:  Alert and cooperative. Normal mood and affect  ASSESSMENT AND PLAN: *83 year old male with weight loss and overall just poor appetite.  No other specific symptoms.  EGD about 3 years ago was unremarkable.  Last colonoscopy July 2012.  I think that we should start by performing a CT scan of the chest, abdomen, and pelvis.  He is a former smoker.  He has been taking mirtazapine, and his wife has noticed an increase in his appetite with that.  Plans for further evaluation pending results of CT scan.   CC:  Velna Hatchet, MD

## 2017-07-03 NOTE — Progress Notes (Signed)
07/03/2017 Eric Barker [id number] October 07, 1933   HISTORY OF PRESENT ILLNESS: This is a pleasant 83 year old male who is known to Dr. Henrene Pastor.  He has history of Barrett's esophagus, but his last EGD in June 2015 was normal with no evidence of such.  His last colonoscopy was in July 2012 at a different facility at which time he was found to have internal hemorrhoids, diverticulosis, and one polyp that was removed and was a tubular adenoma.  He presents to the office today at the request of his PCP, Dr. Ardeth Perfect, for evaluation regarding poor appetite and weight loss.  The patient is here today with his wife.  They report about a 15 pound weight loss over the past year.  He says that he just does not have a good appetite and just never feels hungry for anything  He denies any problems swallowing, any dark or bloody stools, abdominal pain, nausea or vomiting.  His wife tells me that he was recently started on mirtazapine 15 mg at bedtime for the past couple weeks and she has already noticed increase in his appetite since that time.  She says that he has actually said a few times that he is hungry, which has been very unusual for him over the past year.  Recent CMP is normal except a mildly elevated BUN at 29.  CBC shows a mild anemia at 11.2 g.  MCV is normal at 96.  TSH is normal at 3.28.   Past Medical History:  Diagnosis Date  . Barrett's esophagus 2012  . Cancer ([place])   . Chronic kidney disease (CKD), stage III (moderate) (HCC)   . Colon polyps   . GERD (gastroesophageal reflux disease)   . Hemorrhoids   . Hyperlipidemia   . Hypertension   . Iron deficiency anemia   . Melanoma in situ of trunk ([place])   . Osteoarthritis    Past Surgical History:  Procedure Laterality Date  . APPENDECTOMY    . HERNIA REPAIR    . INTRAMEDULLARY (IM) NAIL INTERTROCHANTERIC Right 05/28/2017   Procedure: INTRAMEDULLARY (IM) NAIL INTERTROCHANTRIC RIGHT FEMUR;  Surgeon: Rod Can, MD;  Location:  [facility];  Service: Orthopedics;  Laterality: Right;  Needs RNFA    reports that he quit smoking about 18 years ago. His smoking use included Cigarettes. He smoked 0.50 packs per day. He has never used smokeless tobacco. He reports that he drinks alcohol. He reports that he does not use drugs. family history includes Arthritis in his father and mother; Breast cancer in his mother; Diabetes in his mother; Lung cancer in his father. No Known Allergies    Outpatient Encounter Prescriptions as of 07/03/2017  Medication Sig  . amLODipine (NORVASC) 5 MG tablet Take 5 mg by mouth every evening.  Marland Kitchen aspirin 81 MG chewable tablet Chew 1 tablet (81 mg total) by mouth 2 (two) times daily with a meal.  . celecoxib (CELEBREX) 200 MG capsule Take 200 mg by mouth daily.  Marland Kitchen docusate sodium (COLACE) 100 MG capsule Take 100 mg by mouth daily.  Marland Kitchen ezetimibe-simvastatin (VYTORIN) 10-20 MG per tablet Take 1 tablet by mouth daily.  Marland Kitchen HYDROcodone-acetaminophen (NORCO) 7.5-325 MG tablet Take 1-2 tablets by mouth every 6 (six) hours as needed for severe pain.  Marland Kitchen loperamide (IMODIUM A-D) 2 MG tablet Take 2 mg by mouth as needed for diarrhea or loose stools.  Marland Kitchen losartan (COZAAR) 50 MG tablet Take 50 mg by mouth daily.  . mirtazapine (REMERON) 15 MG  tablet Take 15 mg by mouth at bedtime.  Marland Kitchen omeprazole (PRILOSEC) 20 MG capsule Take 1 capsule (20 mg total) by mouth daily. (Patient taking differently: Take 20 mg by mouth daily as needed (upset stomach/ diarrhea). )  . [DISCONTINUED] hydrocortisone (PROCTOZONE-HC) 2.5 % rectal cream Place 1 application rectally 2 (two) times daily. (Patient not taking: Reported on 05/25/2017)  . [DISCONTINUED] ondansetron (ZOFRAN) 4 MG tablet Take 1 tablet (4 mg total) by mouth every 6 (six) hours as needed for nausea.  . [DISCONTINUED] Probiotic Product (RESTORA) CAPS Take 1 capsule by mouth daily. (Patient not taking: Reported on 05/25/2017)   No facility-administered encounter medications on  file as of 07/03/2017.      REVIEW OF SYSTEMS  : All other systems reviewed and negative except where noted in the History of Present Illness.   PHYSICAL EXAM: BP 134/70   Pulse 64   Ht 5\' 10"  (1.778 m)   Wt 157 lb 3.2 oz (71.3 kg)   BMI 22.56 kg/m  General: Well developed white male in no acute distress Head: Normocephalic and atraumatic Eyes:  Sclerae anicteric, conjunctiva pink. Ears: Normal auditory acuity Lungs: Clear throughout to auscultation; no increased WOB. Heart: Regular rate and rhythm; no M/R/G. Abdomen: Soft, non-distended.  BS present.  Non-tender. Musculoskeletal: Symmetrical with no gross deformities  Skin: No lesions on visible extremities Extremities: No edema  Neurological: Alert oriented x 4, grossly non-focal Psychological:  Alert and cooperative. Normal mood and affect  ASSESSMENT AND PLAN: *83 year old male with weight loss and overall just poor appetite.  No other specific symptoms.  EGD about 3 years ago was unremarkable.  Last colonoscopy July 2012.  I think that we should start by performing a CT scan of the chest, abdomen, and pelvis.  He is a former smoker.  He has been taking mirtazapine, and his wife has noticed an increase in his appetite with that.  Plans for further evaluation pending results of CT scan.   CC:  Velna Hatchet, MD

## 2017-07-07 NOTE — Progress Notes (Signed)
Initial assessment and plans reviewed 

## 2017-07-09 ENCOUNTER — Ambulatory Visit (INDEPENDENT_AMBULATORY_CARE_PROVIDER_SITE_OTHER)
Admission: RE | Admit: 2017-07-09 | Discharge: 2017-07-09 | Disposition: A | Discharge status: Home / Self Care | Payer: Medicare Other | Source: Ambulatory Visit | Attending: Gastroenterology | Admitting: Gastroenterology

## 2017-07-09 DIAGNOSIS — J439 Emphysema, unspecified: Secondary | ICD-10-CM | POA: Diagnosis not present

## 2017-07-09 DIAGNOSIS — R634 Abnormal weight loss: Secondary | ICD-10-CM

## 2017-07-09 DIAGNOSIS — Z122 Encounter for screening for malignant neoplasm of respiratory organs: Secondary | ICD-10-CM

## 2017-07-09 MED ORDER — IOPAMIDOL (ISOVUE-300) INJECTION 61%
100.0000 mL | Freq: Once | INTRAVENOUS | Status: AC | PRN
  Administered 2017-07-09: 100 mL via INTRAVENOUS

## 2017-07-10 ENCOUNTER — Telehealth: Payer: Self-pay

## 2017-07-10 DIAGNOSIS — Z4789 Encounter for other orthopedic aftercare: Secondary | ICD-10-CM | POA: Diagnosis not present

## 2017-07-10 DIAGNOSIS — S72144G Nondisplaced intertrochanteric fracture of right femur, subsequent encounter for closed fracture with delayed healing: Secondary | ICD-10-CM | POA: Diagnosis not present

## 2017-07-10 NOTE — Telephone Encounter (Signed)
-----   Message from Milus Banister, MD sent at 07/10/2017 12:59 PM EDT ----- Keane Scrape, With his weight loss, I do not see the need for other imaging and instead I recommend just proceeding with endoscopic ultrasound and fine-needle aspiration.  If you would be able to let Dr. Henrene Pastor and the patient know those recommendations, I will have Jodel Mayhall get in touch with him to schedule it at his soonest convenience. thanks   Constance Whittle, He needs upper EUS with radial and linear for abnormal pancreas, weight loss.  Next available EUS Thursday, I think there is plenty of time on November 15 still available.  Thanks    ----- Message ----- From: Loralie Champagne, PA-C Sent: 07/10/2017  12:17 PM To: Milus Banister, MD  This is a Henrene Pastor patient who I saw in the office last week.  Just wanted to see if you agree with MRI/MRCP before I order.  Thank you,  Jess   ----- Message ----- From: Interface, Rad Results In Sent: 07/10/2017  10:09 AM To: Loralie Champagne, PA-C

## 2017-07-10 NOTE — Telephone Encounter (Signed)
Notes recorded by Jeoffrey Massed, RN on 07/10/2017 at 1:56 PM EDT Zehr, Laban Emperor, PA-C sent to Jeoffrey Massed, RN    Please let the patient know that the CT scan showed a small area on his pancreas that needs further evaluation in light of his weight loss issues. I spoke with our "pancreas doctor", Dr. Ardis Hughs, and he is recommending an EUS procedure to look at this area further and get some samples from that area.   Thank you,   Jess

## 2017-07-13 ENCOUNTER — Other Ambulatory Visit: Payer: Self-pay

## 2017-07-13 ENCOUNTER — Telehealth: Payer: Self-pay | Admitting: Gastroenterology

## 2017-07-13 DIAGNOSIS — R634 Abnormal weight loss: Secondary | ICD-10-CM

## 2017-07-13 DIAGNOSIS — R948 Abnormal results of function studies of other organs and systems: Secondary | ICD-10-CM

## 2017-07-13 NOTE — Telephone Encounter (Signed)
Zehr, Eric Emperor, PA-C sent to Jeoffrey Massed, RN        Eric Barker,   I actually just spoke with the patient and his wife and gave them the results of the CT scan. They are willing to proceed with EUS so please contact them to scheduled.   Thank you,   Elenora Gamma, He needs upper EUS with radial and linear for abnormal pancreas, weight loss.  Next available EUS Thursday, I think there is plenty of time on November 15 still available.  Thanks  EUS scheduled, pt instructed and medications reviewed.  Patient instructions mailed to home.  Patient to call with any questions or concerns.

## 2017-07-16 ENCOUNTER — Other Ambulatory Visit: Payer: Self-pay

## 2017-07-16 ENCOUNTER — Encounter (HOSPITAL_COMMUNITY): Payer: Self-pay

## 2017-07-23 ENCOUNTER — Ambulatory Visit (HOSPITAL_COMMUNITY): Payer: Medicare Other | Admitting: Anesthesiology

## 2017-07-23 ENCOUNTER — Ambulatory Visit (HOSPITAL_COMMUNITY)
Admission: RE | Admit: 2017-07-23 | Discharge: 2017-07-23 | Disposition: A | Discharge status: Home / Self Care | Payer: Medicare Other | Source: Ambulatory Visit | Attending: Gastroenterology | Admitting: Gastroenterology

## 2017-07-23 ENCOUNTER — Encounter (HOSPITAL_COMMUNITY)
Admission: RE | Disposition: A | Discharge status: Home / Self Care | Payer: Self-pay | Source: Ambulatory Visit | Attending: Gastroenterology

## 2017-07-23 ENCOUNTER — Encounter (HOSPITAL_COMMUNITY): Payer: Self-pay | Admitting: Anesthesiology

## 2017-07-23 ENCOUNTER — Other Ambulatory Visit: Payer: Self-pay

## 2017-07-23 DIAGNOSIS — K2270 Barrett's esophagus without dysplasia: Secondary | ICD-10-CM | POA: Diagnosis not present

## 2017-07-23 DIAGNOSIS — I129 Hypertensive chronic kidney disease with stage 1 through stage 4 chronic kidney disease, or unspecified chronic kidney disease: Secondary | ICD-10-CM | POA: Diagnosis not present

## 2017-07-23 DIAGNOSIS — R634 Abnormal weight loss: Secondary | ICD-10-CM | POA: Insufficient documentation

## 2017-07-23 DIAGNOSIS — R948 Abnormal results of function studies of other organs and systems: Secondary | ICD-10-CM

## 2017-07-23 DIAGNOSIS — Z7982 Long term (current) use of aspirin: Secondary | ICD-10-CM | POA: Insufficient documentation

## 2017-07-23 DIAGNOSIS — K219 Gastro-esophageal reflux disease without esophagitis: Secondary | ICD-10-CM | POA: Insufficient documentation

## 2017-07-23 DIAGNOSIS — K2970 Gastritis, unspecified, without bleeding: Secondary | ICD-10-CM | POA: Diagnosis not present

## 2017-07-23 DIAGNOSIS — K3189 Other diseases of stomach and duodenum: Secondary | ICD-10-CM

## 2017-07-23 DIAGNOSIS — K319 Disease of stomach and duodenum, unspecified: Secondary | ICD-10-CM | POA: Insufficient documentation

## 2017-07-23 DIAGNOSIS — K2990 Gastroduodenitis, unspecified, without bleeding: Secondary | ICD-10-CM

## 2017-07-23 DIAGNOSIS — Z87891 Personal history of nicotine dependence: Secondary | ICD-10-CM | POA: Insufficient documentation

## 2017-07-23 DIAGNOSIS — N183 Chronic kidney disease, stage 3 (moderate): Secondary | ICD-10-CM | POA: Insufficient documentation

## 2017-07-23 DIAGNOSIS — K862 Cyst of pancreas: Secondary | ICD-10-CM | POA: Insufficient documentation

## 2017-07-23 DIAGNOSIS — E785 Hyperlipidemia, unspecified: Secondary | ICD-10-CM | POA: Diagnosis not present

## 2017-07-23 DIAGNOSIS — Z79899 Other long term (current) drug therapy: Secondary | ICD-10-CM | POA: Insufficient documentation

## 2017-07-23 DIAGNOSIS — R8589 Other abnormal findings in specimens from digestive organs and abdominal cavity: Secondary | ICD-10-CM | POA: Diagnosis not present

## 2017-07-23 HISTORY — PX: EUS: SHX5427

## 2017-07-23 LAB — PANC CYST FLD ANLYS-PATHFNDR-TG

## 2017-07-23 SURGERY — UPPER ENDOSCOPIC ULTRASOUND (EUS) LINEAR
Anesthesia: Monitor Anesthesia Care

## 2017-07-23 MED ORDER — EPHEDRINE SULFATE 50 MG/ML IJ SOLN
INTRAMUSCULAR | Status: DC | PRN
  Administered 2017-07-23: 10 mg via INTRAVENOUS

## 2017-07-23 MED ORDER — PROPOFOL 10 MG/ML IV BOLUS
INTRAVENOUS | Status: DC | PRN
  Administered 2017-07-23 (×2): 20 mg via INTRAVENOUS

## 2017-07-23 MED ORDER — LACTATED RINGERS IV SOLN
INTRAVENOUS | Status: DC
Start: 2017-07-23 — End: 2017-07-23
  Administered 2017-07-23: 09:00:00 via INTRAVENOUS

## 2017-07-23 MED ORDER — CIPROFLOXACIN IN D5W 400 MG/200ML IV SOLN
INTRAVENOUS | Status: AC
  Filled 2017-07-23: qty 200

## 2017-07-23 MED ORDER — CIPROFLOXACIN HCL 500 MG PO TABS: 500.0000 mg | ORAL_TABLET | Freq: Two times a day (BID) | ORAL | 0 refills | Status: AC

## 2017-07-23 MED ORDER — PROPOFOL 10 MG/ML IV BOLUS
INTRAVENOUS | Status: AC
  Filled 2017-07-23: qty 40

## 2017-07-23 MED ORDER — CIPROFLOXACIN IN D5W 400 MG/200ML IV SOLN
400.0000 mg | Freq: Once | INTRAVENOUS | Status: AC
  Administered 2017-07-23: 400 mg via INTRAVENOUS

## 2017-07-23 MED ORDER — LIDOCAINE 2% (20 MG/ML) 5 ML SYRINGE
INTRAMUSCULAR | Status: DC | PRN
  Administered 2017-07-23: 80 mg via INTRAVENOUS

## 2017-07-23 MED ORDER — LIDOCAINE 2% (20 MG/ML) 5 ML SYRINGE
INTRAMUSCULAR | Status: AC
  Filled 2017-07-23: qty 5

## 2017-07-23 MED ORDER — EPHEDRINE 5 MG/ML INJ
INTRAVENOUS | Status: AC
  Filled 2017-07-23: qty 10

## 2017-07-23 MED ORDER — SODIUM CHLORIDE 0.9 % IV SOLN: INTRAVENOUS | Status: DC

## 2017-07-23 MED ORDER — PROPOFOL 500 MG/50ML IV EMUL
INTRAVENOUS | Status: DC | PRN
  Administered 2017-07-23: 150 ug/kg/min via INTRAVENOUS

## 2017-07-23 NOTE — Anesthesia Procedure Notes (Signed)
Procedure Name: MAC Date/Time: 07/23/2017 9:04 AM Performed by: Dione Booze, CRNA Pre-anesthesia Checklist: Patient identified, Emergency Drugs available, Suction available and Patient being monitored Patient Re-evaluated:Patient Re-evaluated prior to induction Oxygen Delivery Method: Non-rebreather mask Placement Confirmation: positive ETCO2

## 2017-07-23 NOTE — Op Note (Signed)
[facility] Patient Name: Eric Barker Procedure Date: 07/23/2017 MRN: [id number] Attending MD: Milus Banister , MD Date of Birth: [birth year] CSN: [id number] Age: 83 Admit Type: Outpatient Procedure:                Upper EUS Indications:              Pancreatic cyst on CT scan, weight loss Providers:                Milus Banister, MD, Cleda Daub, RN, Corliss Parish, Technician Referring MD:             Scarlette Shorts, MD Medicines:                Monitored Anesthesia Care, cipro 400mg  IV Complications:            No immediate complications. Estimated blood loss:                            None. Estimated Blood Loss:     Estimated blood loss: none. Procedure:                Pre-Anesthesia Assessment:                           - Prior to the procedure, a History and Physical                            was performed, and patient medications and                            allergies were reviewed. The patient's tolerance of                            previous anesthesia was also reviewed. The risks                            and benefits of the procedure and the sedation                            options and risks were discussed with the patient.                            All questions were answered, and informed consent                            was obtained. Prior Anticoagulants: The patient has                            taken no previous anticoagulant or antiplatelet                            agents. ASA Grade Assessment: II - A patient with  mild systemic disease. After reviewing the risks                            and benefits, the patient was deemed in                            satisfactory condition to undergo the procedure.                           After obtaining informed consent, the endoscope was                            passed under direct vision. Throughout the   procedure, the patient's blood pressure, pulse, and                            oxygen saturations were monitored continuously. The                            [id number] ([id number]) scope was introduced through                            the mouth, and advanced to the second part of                            duodenum. The [id number] was introduced through the                            mouth, and advanced to the second part of duodenum.                            The [id number] ([id number]) scope was introduced through                            the mouth, and advanced to the second part of                            duodenum. The upper EUS was accomplished without                            difficulty. The patient tolerated the procedure                            well. Scope In: Scope Out: Findings:      Endoscopic Findings: :      1. The esophagus was normal.      2. There was moderate inflammation characterized by erythema and       friability was found in the gastric antrum, body. Biopsies were taken       with a cold forceps for histology.      3. The duodenum was normal.      Endosonographic Finding :      1. A multicystic and septated lesion suggestive of a cyst was identified       in the pancreatic head and in the  pancreatic neck. It is not in obvious       communication with the pancreatic duct. The lesion measured 26 mm in       maximal cross-sectional diameter. There were many compartments thinly       septated. There was no associated solid mass. The cystic lesion abuts       the main portal vein for about 1cm. Needle aspiration for fluid was       performed. One pass was made with the 22 gauge needle using a       transgastric approach. The amount of fluid collected was about 2 mL. The       fluid was clear and thick and it was sent for amylase concentration,       cytology and CEA.      2. Pancreatic parenchyma was otherwise normal.      3. Main pancreatic duct was non-dilated  in body and tail.      4. No peripancreatic adenopathy.      5. CBD was normal, non-dilated      6. Limited views of liver, spleen, portal and splenic vessels were all       normal. Impression:               - Mild gastritis, biopsied to check for H. pylori.                           - 2.6cm cystic lesion in the head/body of pancreas                            without concerning morphologic features (no                            associated solid masses, normal/non-dilated main                            pancreatic duct). The cyst fluid was sampled, sent                            for CEA, amylase and cytology. Moderate Sedation:      N/A- Per Anesthesia Care Recommendation:           - Discharge patient to home (ambulatory).                           - Await final fluid tesing results.                           - He will complete 3 days of twice daily cipro                            (called in to pharmacy today). Procedure Code(s):        --- Professional ---                           [phone], Esophagogastroduodenoscopy, flexible,                            transoral; with transendoscopic ultrasound-guided  intramural or transmural fine needle                            aspiration/biopsy(s), (includes endoscopic                            ultrasound examination limited to the esophagus,                            stomach or duodenum, and adjacent structures)                           43239, 59, Esophagogastroduodenoscopy, flexible,                            transoral; with biopsy, single or multiple Diagnosis Code(s):        --- Professional ---                           K29.70, Gastritis, unspecified, without bleeding                           K86.2, Cyst of pancreas CPT copyright 2016 American Medical Association. All rights reserved. The codes documented in this report are preliminary and upon coder review may  be revised to meet current compliance  requirements. Milus Banister, MD 07/23/2017 10:02:42 AM This report has been signed electronically. Number of Addenda: 0

## 2017-07-23 NOTE — Transfer of Care (Signed)
Immediate Anesthesia Transfer of Care Note  Patient: Eric Barker  Procedure(s) Performed: UPPER ENDOSCOPIC ULTRASOUND (EUS) LINEAR (N/A )  Patient Location: PACU and Endoscopy Unit  Anesthesia Type:MAC  Level of Consciousness: awake and patient cooperative  Airway & Oxygen Therapy: Patient Spontanous Breathing and Patient connected to nasal cannula oxygen  Post-op Assessment: Report given to RN and Post -op Vital signs reviewed and stable  Post vital signs: Reviewed and stable  Last Vitals:  Vitals:   07/23/17 0833  BP: (!) 147/83  Resp: (!) 60  Temp: 36.5 C  SpO2: 100%    Last Pain:  Vitals:   07/23/17 0833  TempSrc: Oral         Complications: No apparent anesthesia complications

## 2017-07-23 NOTE — Interval H&P Note (Signed)
History and Physical Interval Note:  07/23/2017 8:49 AM  Eric Barker  has presented today for surgery, with the diagnosis of abnormal pancreas, weight loss  The various methods of treatment have been discussed with the patient and family. After consideration of risks, benefits and other options for treatment, the patient has consented to  Procedure(s) with comments: UPPER ENDOSCOPIC ULTRASOUND (EUS) LINEAR (N/A) - 09030 CPT as a surgical intervention .  The patient's history has been reviewed, patient examined, no change in status, stable for surgery.  I have reviewed the patient's chart and labs.  Questions were answered to the patient's satisfaction.     Milus Banister

## 2017-07-23 NOTE — Discharge Instructions (Signed)

## 2017-07-23 NOTE — Anesthesia Postprocedure Evaluation (Signed)
Anesthesia Post Note  Patient: Eric Barker  Procedure(s) Performed: UPPER ENDOSCOPIC ULTRASOUND (EUS) LINEAR (N/A )     Patient location during evaluation: PACU Anesthesia Type: MAC Level of consciousness: awake and alert and oriented Pain management: pain level controlled Vital Signs Assessment: post-procedure vital signs reviewed and stable Respiratory status: spontaneous breathing, nonlabored ventilation and respiratory function stable Cardiovascular status: stable and blood pressure returned to baseline Postop Assessment: no apparent nausea or vomiting Anesthetic complications: no    Last Vitals:  Vitals:   07/23/17 1010 07/23/17 1015  BP: 134/74   Pulse: 62 60  Resp: (!) 21 17  Temp:    SpO2: 100% 99%    Last Pain:  Vitals:   07/23/17 0956  TempSrc: Oral                 Moriyah Byington A.

## 2017-07-23 NOTE — Anesthesia Preprocedure Evaluation (Addendum)
Anesthesia Evaluation  Patient identified by MRN, date of birth, ID band Patient awake    Reviewed: Allergy & Precautions, NPO status , Patient's Chart, lab work & pertinent test results  Airway Mallampati: II  TM Distance: >3 FB Neck ROM: Full    Dental no notable dental hx.    Pulmonary former smoker,    Pulmonary exam normal breath sounds clear to auscultation       Cardiovascular hypertension, Pt. on medications Normal cardiovascular exam Rhythm:Regular Rate:Normal     Neuro/Psych negative neurological ROS  negative psych ROS   GI/Hepatic GERD  Medicated and Controlled,Abnormal pancreas  Weight loss Barrett's esophagus   Endo/Other  Hyperglycemia Hyperlipidemia  Renal/GU Renal InsufficiencyRenal disease  negative genitourinary   Musculoskeletal  (+) Arthritis , Osteoarthritis,  Hx/o recent Femur Fx   Abdominal   Peds  Hematology  (+) anemia ,   Anesthesia Other Findings   Reproductive/Obstetrics                           Anesthesia Physical Anesthesia Plan  ASA: III  Anesthesia Plan: MAC   Post-op Pain Management:    Induction:   PONV Risk Score and Plan: Treatment may vary due to age or medical condition and Ondansetron  Airway Management Planned: Natural Airway and Nasal Cannula  Additional Equipment:   Intra-op Plan:   Post-operative Plan:   Informed Consent: I have reviewed the patients History and Physical, chart, labs and discussed the procedure including the risks, benefits and alternatives for the proposed anesthesia with the patient or authorized representative who has indicated his/her understanding and acceptance.   Dental advisory given  Plan Discussed with: CRNA, Anesthesiologist and Surgeon  Anesthesia Plan Comments:         Anesthesia Quick Evaluation

## 2017-07-24 ENCOUNTER — Encounter (HOSPITAL_COMMUNITY): Payer: Self-pay | Admitting: Gastroenterology

## 2017-08-07 ENCOUNTER — Telehealth: Payer: Self-pay | Admitting: Gastroenterology

## 2017-08-07 NOTE — Telephone Encounter (Signed)
The pt has been advised that Dr Ardis Hughs will be reviewing the results and will call back when reviewed

## 2017-08-07 NOTE — Telephone Encounter (Signed)
Patient's wife calling back states she will be away from home and best call back # will be mobile # [phone] when results are ready.

## 2017-08-10 NOTE — Telephone Encounter (Signed)
The pt will find out if the insurance will allow him to be seen at [facility].  If so, they will request records to be sent for review.

## 2017-08-10 NOTE — Telephone Encounter (Signed)
Notes recorded by Milus Banister, MD on 08/07/2017 at 12:10 PM EST CEA 5,604 ng/mL Amylase 2,324 U/L Cytology negative  I spoke with his wife about the test results. She understands that the fluid testing shows no obvious sign of cancer but with the elevated CEA level this might be a precancerous, mucinous type cyst. I recommended they at least sit down and discuss resection with a surgeon. He is 69 and it would be a Whipple surgery and so that might not be the ideal plan here, but I think at least hearing their options is best.  Eric Barker, can you refer him to [facility] surgery Dr. Barry Dienes about cystic neoplasm of pancreas. ------

## 2017-08-10 NOTE — Telephone Encounter (Signed)
Patient's wife calling states her husband would like to speak with Dr.Jacobs regarding results best call back# [phone].

## 2017-08-18 DIAGNOSIS — E7849 Other hyperlipidemia: Secondary | ICD-10-CM | POA: Diagnosis not present

## 2017-08-18 DIAGNOSIS — M1990 Unspecified osteoarthritis, unspecified site: Secondary | ICD-10-CM | POA: Diagnosis not present

## 2017-08-18 DIAGNOSIS — R634 Abnormal weight loss: Secondary | ICD-10-CM | POA: Diagnosis not present

## 2017-08-18 DIAGNOSIS — N183 Chronic kidney disease, stage 3 (moderate): Secondary | ICD-10-CM | POA: Diagnosis not present

## 2017-08-18 DIAGNOSIS — K862 Cyst of pancreas: Secondary | ICD-10-CM | POA: Diagnosis not present

## 2017-08-18 DIAGNOSIS — I10 Essential (primary) hypertension: Secondary | ICD-10-CM | POA: Diagnosis not present

## 2017-08-18 DIAGNOSIS — Z6824 Body mass index (BMI) 24.0-24.9, adult: Secondary | ICD-10-CM | POA: Diagnosis not present

## 2017-08-19 DIAGNOSIS — K3189 Other diseases of stomach and duodenum: Secondary | ICD-10-CM | POA: Diagnosis not present

## 2017-08-19 DIAGNOSIS — R896 Abnormal cytological findings in specimens from other organs, systems and tissues: Secondary | ICD-10-CM | POA: Diagnosis not present

## 2017-08-21 DIAGNOSIS — E785 Hyperlipidemia, unspecified: Secondary | ICD-10-CM | POA: Diagnosis not present

## 2017-08-21 DIAGNOSIS — K862 Cyst of pancreas: Secondary | ICD-10-CM | POA: Diagnosis not present

## 2017-08-21 DIAGNOSIS — Z803 Family history of malignant neoplasm of breast: Secondary | ICD-10-CM | POA: Diagnosis not present

## 2017-08-21 DIAGNOSIS — Z9889 Other specified postprocedural states: Secondary | ICD-10-CM | POA: Diagnosis not present

## 2017-08-21 DIAGNOSIS — N183 Chronic kidney disease, stage 3 (moderate): Secondary | ICD-10-CM | POA: Diagnosis not present

## 2017-08-21 DIAGNOSIS — D490 Neoplasm of unspecified behavior of digestive system: Secondary | ICD-10-CM | POA: Diagnosis not present

## 2017-08-21 DIAGNOSIS — Z801 Family history of malignant neoplasm of trachea, bronchus and lung: Secondary | ICD-10-CM | POA: Diagnosis not present

## 2017-08-21 DIAGNOSIS — R634 Abnormal weight loss: Secondary | ICD-10-CM | POA: Diagnosis not present

## 2017-08-21 DIAGNOSIS — Z8719 Personal history of other diseases of the digestive system: Secondary | ICD-10-CM | POA: Diagnosis not present

## 2017-08-21 DIAGNOSIS — I129 Hypertensive chronic kidney disease with stage 1 through stage 4 chronic kidney disease, or unspecified chronic kidney disease: Secondary | ICD-10-CM | POA: Diagnosis not present

## 2017-08-27 ENCOUNTER — Other Ambulatory Visit: Payer: Self-pay | Admitting: Surgery

## 2017-08-27 DIAGNOSIS — D490 Neoplasm of unspecified behavior of digestive system: Secondary | ICD-10-CM

## 2017-11-02 ENCOUNTER — Telehealth: Payer: Self-pay | Admitting: Gastroenterology

## 2017-11-02 NOTE — Telephone Encounter (Signed)
Error Patient only wanted to know where he had his last CT scan done at

## 2017-12-08 ENCOUNTER — Other Ambulatory Visit: Payer: Medicare Other

## 2017-12-10 ENCOUNTER — Other Ambulatory Visit: Payer: Self-pay | Admitting: Surgery

## 2017-12-10 ENCOUNTER — Ambulatory Visit
Admission: RE | Admit: 2017-12-10 | Discharge: 2017-12-10 | Disposition: A | Discharge status: Home / Self Care | Payer: Medicare Other | Source: Ambulatory Visit | Attending: Surgery | Admitting: Surgery

## 2017-12-10 DIAGNOSIS — D490 Neoplasm of unspecified behavior of digestive system: Secondary | ICD-10-CM

## 2017-12-10 DIAGNOSIS — K8689 Other specified diseases of pancreas: Secondary | ICD-10-CM | POA: Diagnosis not present

## 2017-12-10 MED ORDER — IOPAMIDOL (ISOVUE-300) INJECTION 61%
100.0000 mL | Freq: Once | INTRAVENOUS | Status: AC | PRN
  Administered 2017-12-10: 100 mL via INTRAVENOUS

## 2017-12-11 DIAGNOSIS — K862 Cyst of pancreas: Secondary | ICD-10-CM | POA: Diagnosis not present

## 2017-12-11 DIAGNOSIS — G609 Hereditary and idiopathic neuropathy, unspecified: Secondary | ICD-10-CM | POA: Diagnosis not present

## 2017-12-11 DIAGNOSIS — I10 Essential (primary) hypertension: Secondary | ICD-10-CM | POA: Diagnosis not present

## 2017-12-11 DIAGNOSIS — M25569 Pain in unspecified knee: Secondary | ICD-10-CM | POA: Diagnosis not present

## 2017-12-11 DIAGNOSIS — Z6824 Body mass index (BMI) 24.0-24.9, adult: Secondary | ICD-10-CM | POA: Diagnosis not present

## 2017-12-28 DIAGNOSIS — K862 Cyst of pancreas: Secondary | ICD-10-CM | POA: Diagnosis not present

## 2017-12-28 DIAGNOSIS — C250 Malignant neoplasm of head of pancreas: Secondary | ICD-10-CM | POA: Diagnosis not present

## 2017-12-28 DIAGNOSIS — R799 Abnormal finding of blood chemistry, unspecified: Secondary | ICD-10-CM | POA: Diagnosis not present

## 2017-12-29 ENCOUNTER — Other Ambulatory Visit: Payer: Self-pay | Admitting: General Surgery

## 2017-12-29 DIAGNOSIS — K862 Cyst of pancreas: Secondary | ICD-10-CM

## 2018-02-17 DIAGNOSIS — L820 Inflamed seborrheic keratosis: Secondary | ICD-10-CM | POA: Diagnosis not present

## 2018-02-17 DIAGNOSIS — D485 Neoplasm of uncertain behavior of skin: Secondary | ICD-10-CM | POA: Diagnosis not present

## 2018-02-17 DIAGNOSIS — D225 Melanocytic nevi of trunk: Secondary | ICD-10-CM | POA: Diagnosis not present

## 2018-02-17 DIAGNOSIS — L814 Other melanin hyperpigmentation: Secondary | ICD-10-CM | POA: Diagnosis not present

## 2018-02-17 DIAGNOSIS — L570 Actinic keratosis: Secondary | ICD-10-CM | POA: Diagnosis not present

## 2018-03-04 DIAGNOSIS — Z6824 Body mass index (BMI) 24.0-24.9, adult: Secondary | ICD-10-CM | POA: Diagnosis not present

## 2018-03-04 DIAGNOSIS — M7542 Impingement syndrome of left shoulder: Secondary | ICD-10-CM | POA: Diagnosis not present

## 2018-03-04 DIAGNOSIS — G5692 Unspecified mononeuropathy of left upper limb: Secondary | ICD-10-CM | POA: Diagnosis not present

## 2018-06-21 DIAGNOSIS — I10 Essential (primary) hypertension: Secondary | ICD-10-CM | POA: Diagnosis not present

## 2018-06-21 DIAGNOSIS — E7849 Other hyperlipidemia: Secondary | ICD-10-CM | POA: Diagnosis not present

## 2018-06-21 DIAGNOSIS — R82998 Other abnormal findings in urine: Secondary | ICD-10-CM | POA: Diagnosis not present

## 2018-06-21 DIAGNOSIS — R946 Abnormal results of thyroid function studies: Secondary | ICD-10-CM | POA: Diagnosis not present

## 2018-06-21 DIAGNOSIS — Z125 Encounter for screening for malignant neoplasm of prostate: Secondary | ICD-10-CM | POA: Diagnosis not present

## 2018-06-25 ENCOUNTER — Ambulatory Visit
Admission: RE | Admit: 2018-06-25 | Discharge: 2018-06-25 | Disposition: A | Discharge status: Home / Self Care | Payer: Medicare Other | Source: Ambulatory Visit | Attending: General Surgery | Admitting: General Surgery

## 2018-06-25 DIAGNOSIS — K862 Cyst of pancreas: Secondary | ICD-10-CM | POA: Diagnosis not present

## 2018-06-25 MED ORDER — IOPAMIDOL (ISOVUE-300) INJECTION 61%
80.0000 mL | Freq: Once | INTRAVENOUS | Status: AC | PRN
  Administered 2018-06-25: 80 mL via INTRAVENOUS

## 2018-06-28 DIAGNOSIS — N183 Chronic kidney disease, stage 3 (moderate): Secondary | ICD-10-CM | POA: Diagnosis not present

## 2018-06-28 DIAGNOSIS — G5692 Unspecified mononeuropathy of left upper limb: Secondary | ICD-10-CM | POA: Diagnosis not present

## 2018-06-28 DIAGNOSIS — M1990 Unspecified osteoarthritis, unspecified site: Secondary | ICD-10-CM | POA: Diagnosis not present

## 2018-06-28 DIAGNOSIS — E7849 Other hyperlipidemia: Secondary | ICD-10-CM | POA: Diagnosis not present

## 2018-06-28 DIAGNOSIS — Z0000 Encounter for general adult medical examination without abnormal findings: Secondary | ICD-10-CM | POA: Diagnosis not present

## 2018-06-28 DIAGNOSIS — Z23 Encounter for immunization: Secondary | ICD-10-CM | POA: Diagnosis not present

## 2018-06-28 DIAGNOSIS — Z1389 Encounter for screening for other disorder: Secondary | ICD-10-CM | POA: Diagnosis not present

## 2018-06-28 DIAGNOSIS — K862 Cyst of pancreas: Secondary | ICD-10-CM | POA: Diagnosis not present

## 2018-06-28 DIAGNOSIS — I129 Hypertensive chronic kidney disease with stage 1 through stage 4 chronic kidney disease, or unspecified chronic kidney disease: Secondary | ICD-10-CM | POA: Diagnosis not present

## 2018-06-28 DIAGNOSIS — D6489 Other specified anemias: Secondary | ICD-10-CM | POA: Diagnosis not present

## 2018-06-28 DIAGNOSIS — Z6825 Body mass index (BMI) 25.0-25.9, adult: Secondary | ICD-10-CM | POA: Diagnosis not present

## 2018-06-28 DIAGNOSIS — I10 Essential (primary) hypertension: Secondary | ICD-10-CM | POA: Diagnosis not present

## 2018-07-02 DIAGNOSIS — Z1212 Encounter for screening for malignant neoplasm of rectum: Secondary | ICD-10-CM | POA: Diagnosis not present

## 2018-07-12 DIAGNOSIS — K862 Cyst of pancreas: Secondary | ICD-10-CM | POA: Diagnosis not present

## 2018-07-20 ENCOUNTER — Other Ambulatory Visit: Payer: Self-pay | Admitting: General Surgery

## 2018-07-20 DIAGNOSIS — K862 Cyst of pancreas: Secondary | ICD-10-CM

## 2018-08-16 DIAGNOSIS — M170 Bilateral primary osteoarthritis of knee: Secondary | ICD-10-CM | POA: Diagnosis not present

## 2018-08-16 DIAGNOSIS — M1712 Unilateral primary osteoarthritis, left knee: Secondary | ICD-10-CM | POA: Diagnosis not present

## 2018-08-16 DIAGNOSIS — M1711 Unilateral primary osteoarthritis, right knee: Secondary | ICD-10-CM | POA: Diagnosis not present

## 2018-09-04 DIAGNOSIS — M542 Cervicalgia: Secondary | ICD-10-CM | POA: Diagnosis not present

## 2018-09-04 DIAGNOSIS — M5030 Other cervical disc degeneration, unspecified cervical region: Secondary | ICD-10-CM | POA: Diagnosis not present

## 2018-12-06 DIAGNOSIS — R69 Illness, unspecified: Secondary | ICD-10-CM | POA: Diagnosis not present

## 2018-12-27 ENCOUNTER — Other Ambulatory Visit: Payer: Medicare Other

## 2018-12-29 DIAGNOSIS — I129 Hypertensive chronic kidney disease with stage 1 through stage 4 chronic kidney disease, or unspecified chronic kidney disease: Secondary | ICD-10-CM | POA: Diagnosis not present

## 2019-01-06 DIAGNOSIS — M1711 Unilateral primary osteoarthritis, right knee: Secondary | ICD-10-CM | POA: Diagnosis not present

## 2019-01-06 DIAGNOSIS — M1712 Unilateral primary osteoarthritis, left knee: Secondary | ICD-10-CM | POA: Diagnosis not present

## 2019-02-15 ENCOUNTER — Other Ambulatory Visit: Payer: Self-pay

## 2019-02-15 ENCOUNTER — Ambulatory Visit
Admission: RE | Admit: 2019-02-15 | Discharge: 2019-02-15 | Disposition: A | Discharge status: Home / Self Care | Payer: Medicare Other | Source: Ambulatory Visit | Attending: General Surgery | Admitting: General Surgery

## 2019-02-15 DIAGNOSIS — K862 Cyst of pancreas: Secondary | ICD-10-CM | POA: Diagnosis not present

## 2019-02-15 MED ORDER — GADOBENATE DIMEGLUMINE 529 MG/ML IV SOLN
8.0000 mL | Freq: Once | INTRAVENOUS | Status: AC | PRN
  Administered 2019-02-15: 8 mL via INTRAVENOUS

## 2019-02-23 DIAGNOSIS — C250 Malignant neoplasm of head of pancreas: Secondary | ICD-10-CM | POA: Diagnosis not present

## 2019-02-23 DIAGNOSIS — K862 Cyst of pancreas: Secondary | ICD-10-CM | POA: Diagnosis not present

## 2019-03-07 DIAGNOSIS — K862 Cyst of pancreas: Secondary | ICD-10-CM | POA: Diagnosis not present

## 2019-03-13 IMAGING — CT CT CHEST W/ CM
2 of 5 series · 14 of 46 positions shown, 16 images · IV contrast (ISOVUE 300)
Comparison: None.

CLINICAL DATA: Abnormal weight loss for 6 months. Diffuse abdominal
pain. Former smoker.

EXAM:
CT CHEST, ABDOMEN, AND PELVIS WITH CONTRAST
TECHNIQUE: Multidetector CT imaging of the chest, abdomen and pelvis was
performed following the standard protocol during bolus
administration of intravenous contrast.
CONTRAST:  100mL 5C3FC2-J44 IOPAMIDOL (5C3FC2-J44) INJECTION 61%

[Series 2: cap with · axial · 0.70mm/px · z∈[-580,-60]mm · 11 of 126 slices shown, 13 images]
[im 11/126  soft-tissue]
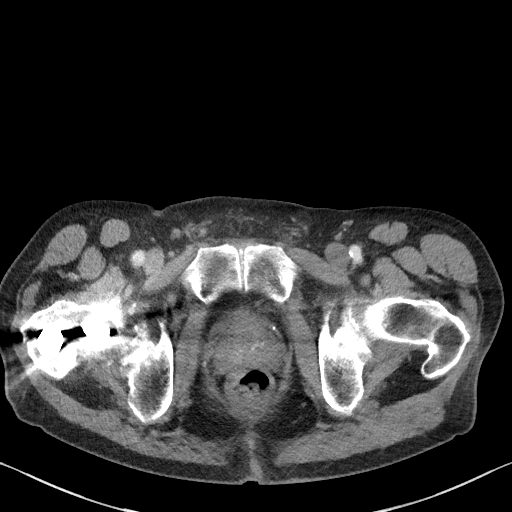
[im 11/126  bone]
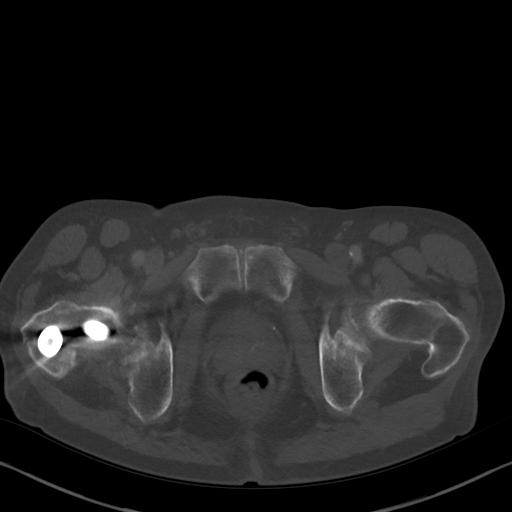
[im 21/126  soft-tissue]
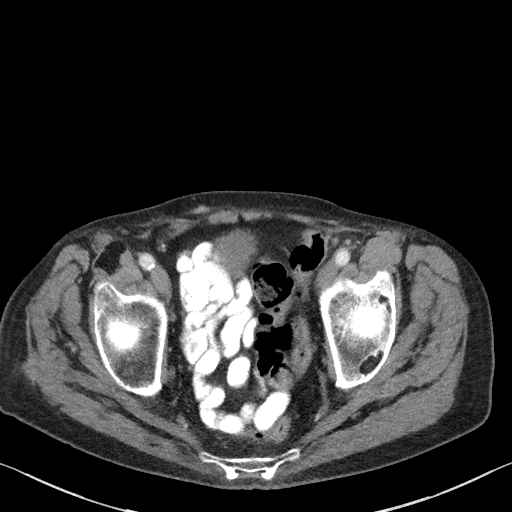
[im 32/126  soft-tissue]
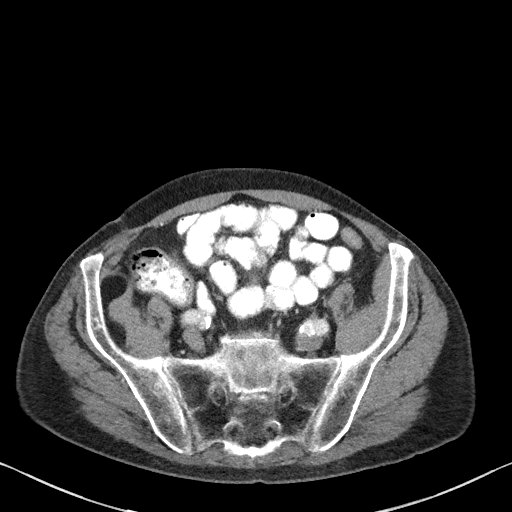
[im 42/126  soft-tissue]
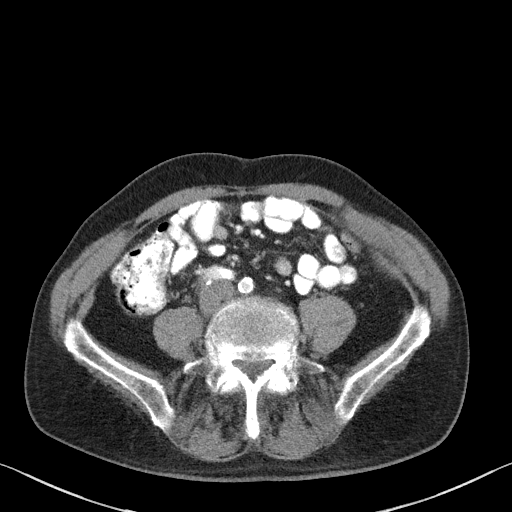
[im 53/126  soft-tissue]
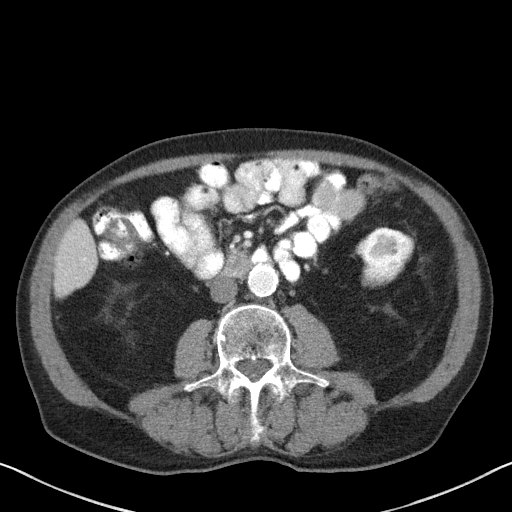
[im 63/126  soft-tissue]
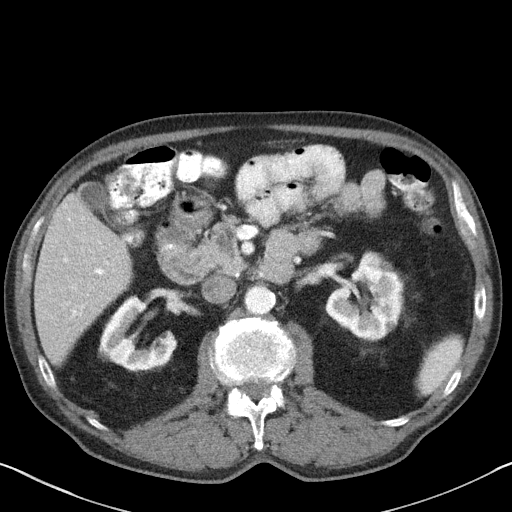
[im 73/126  soft-tissue]
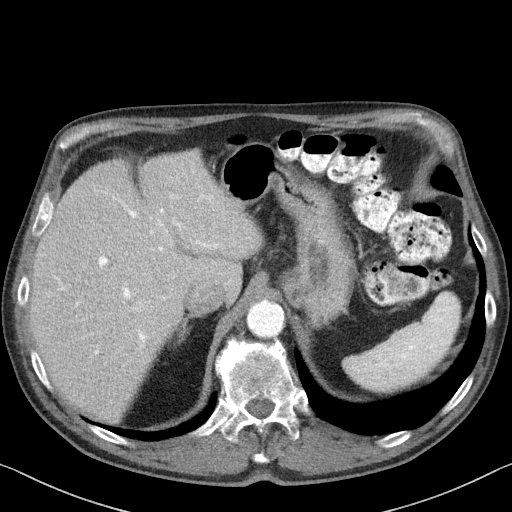
[im 84/126  soft-tissue]
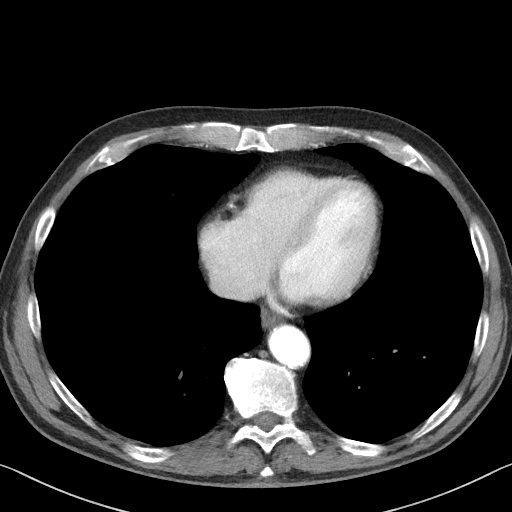
[im 94/126  soft-tissue]
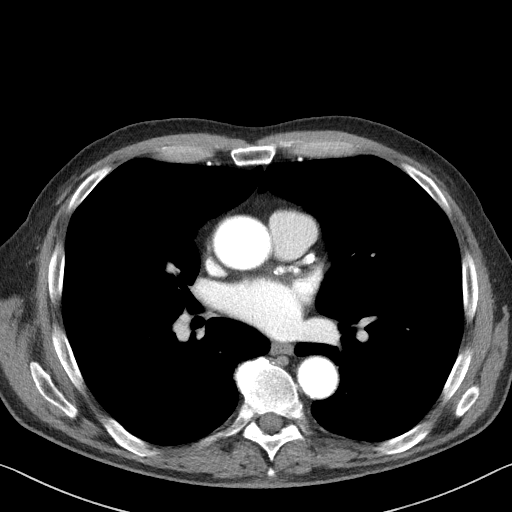
[im 94/126  bone]
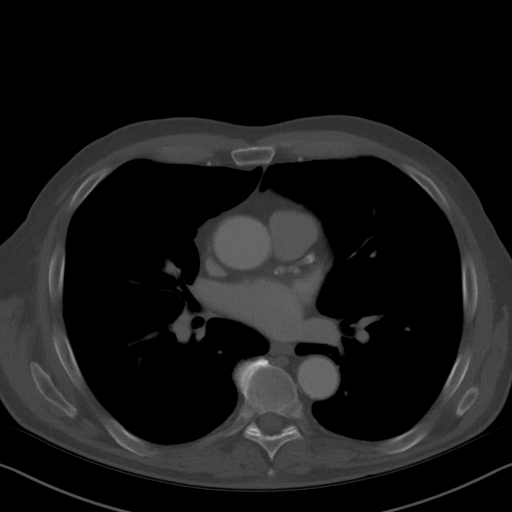
[im 105/126  soft-tissue]
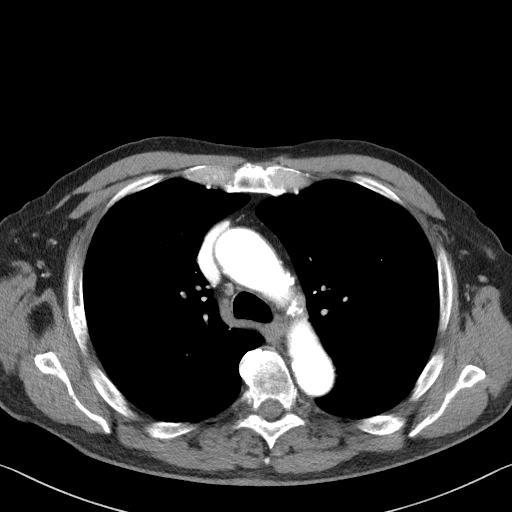
[im 115/126  soft-tissue]
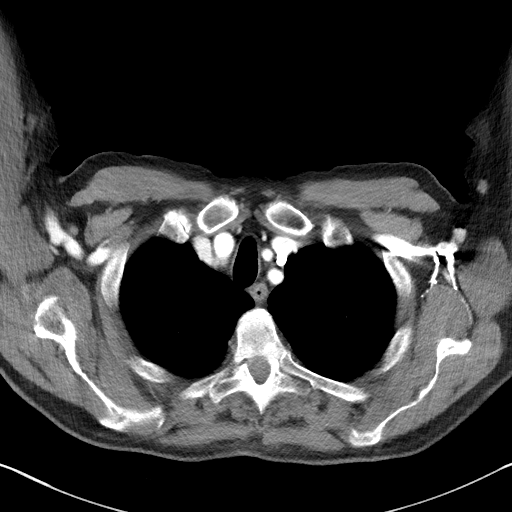

[Series 6: coronal · coronal · 0.75mm/px · 3 of 126 slices shown]
[im 42/126  soft-tissue]
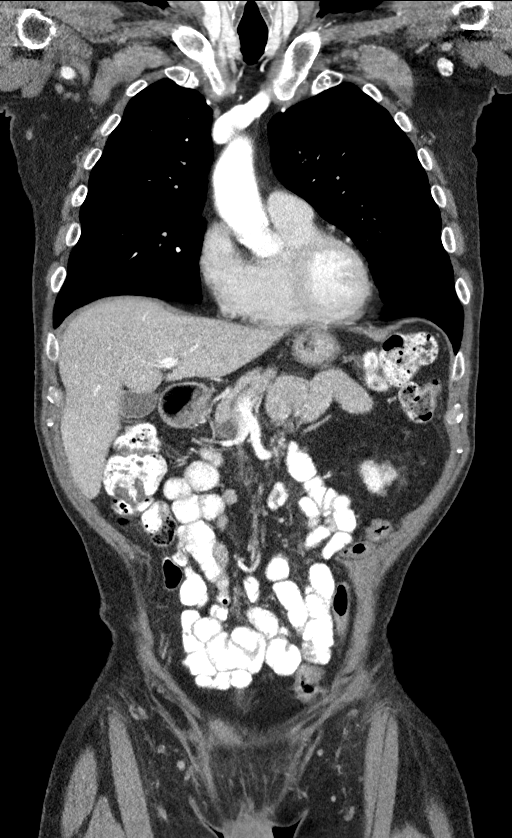
[im 56/126  soft-tissue]
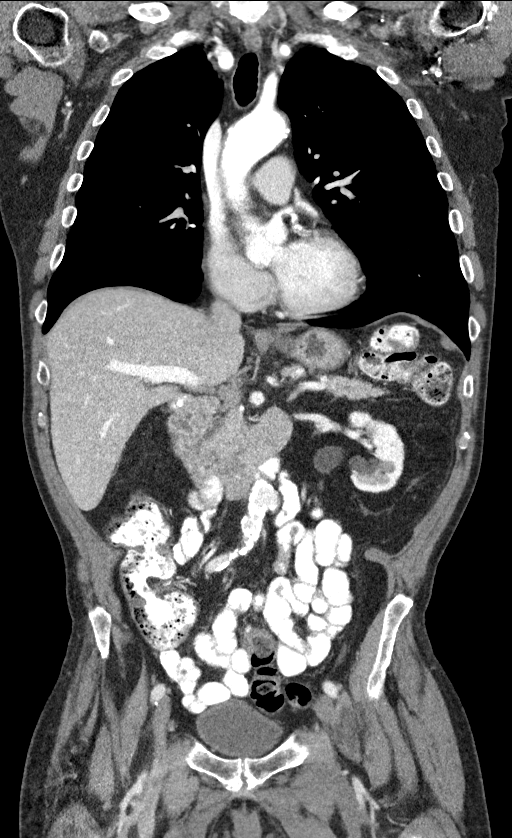
[im 70/126  soft-tissue]
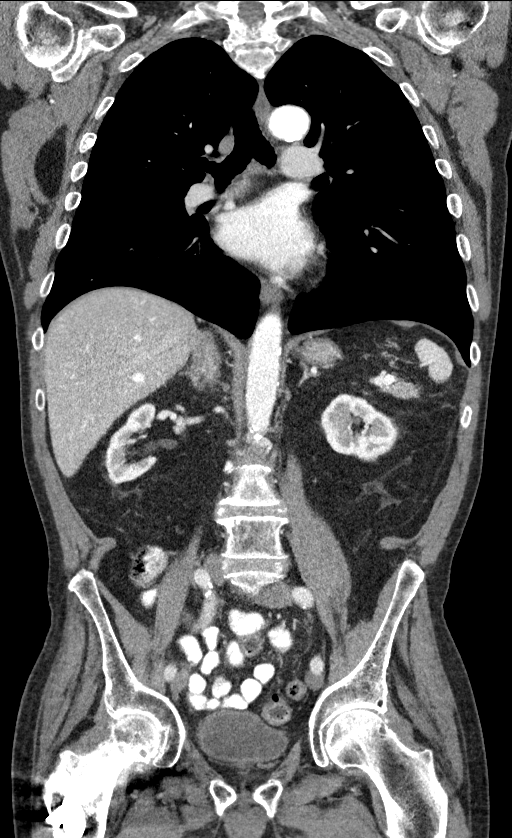

[14 of 46 positions shown; findings below may reference images not displayed]

FINDINGS: CT CHEST FINDINGS

Cardiovascular: No acute findings. Aortic and coronary artery
atherosclerosis.

Mediastinum/Lymph Nodes: No masses or pathologically enlarged lymph
nodes identified.

Lungs/Pleura: Mild centrilobular emphysema. No pulmonary infiltrate
or mass identified. No effusion present.

Musculoskeletal: No suspicious bone lesions identified. Incidentally
noted right subscapularis intramuscular lipoma.

CT ABDOMEN AND PELVIS FINDINGS

Hepatobiliary: No masses identified. Small left hepatic lobe cyst
noted. Gallbladder is unremarkable.

Pancreas: A complex cystic lesion is seen in pancreatic head in
uncinate which measures 3.0 x 1.5 cm on image 65/2. No other
pancreatic masses or pancreatic ductal dilatation.

Spleen:  Within normal limits in size and appearance.

Adrenals/Urinary tract: No masses or hydronephrosis. Several tiny
renal cysts noted bilaterally. Small left-sided urinary bladder
diverticulum.

Stomach/Bowel: No evidence of obstruction, inflammatory process, or
abnormal fluid collections.

Vascular/Lymphatic: No pathologically enlarged lymph nodes
identified. No abdominal aortic aneurysm. Aortic atherosclerosis.

Reproductive:  No mass or other significant abnormality identified.

Other:  Internal fixation hardware noted in left hip.

Musculoskeletal:  No suspicious bone lesions identified.
IMPRESSION: 3 cm complex cystic lesion in pancreatic head and uncinate,
suspicious for cystic pancreatic neoplasm. Abdomen MRI and MRCP
without and with contrast recommended for further characterization.

No other masses or lymphadenopathy identified.

Aortic Atherosclerosis (O8AVC-32V.V) and Emphysema (O8AVC-KBM.G).

## 2019-04-28 DIAGNOSIS — Z23 Encounter for immunization: Secondary | ICD-10-CM | POA: Diagnosis not present

## 2019-05-02 DIAGNOSIS — M1711 Unilateral primary osteoarthritis, right knee: Secondary | ICD-10-CM | POA: Diagnosis not present

## 2019-05-02 DIAGNOSIS — M1712 Unilateral primary osteoarthritis, left knee: Secondary | ICD-10-CM | POA: Diagnosis not present

## 2019-05-02 DIAGNOSIS — M170 Bilateral primary osteoarthritis of knee: Secondary | ICD-10-CM | POA: Diagnosis not present

## 2019-05-09 DIAGNOSIS — H524 Presbyopia: Secondary | ICD-10-CM | POA: Diagnosis not present

## 2019-06-29 DIAGNOSIS — E7849 Other hyperlipidemia: Secondary | ICD-10-CM | POA: Diagnosis not present

## 2019-06-29 DIAGNOSIS — Z125 Encounter for screening for malignant neoplasm of prostate: Secondary | ICD-10-CM | POA: Diagnosis not present

## 2019-06-29 DIAGNOSIS — M810 Age-related osteoporosis without current pathological fracture: Secondary | ICD-10-CM | POA: Diagnosis not present

## 2019-07-05 DIAGNOSIS — R82998 Other abnormal findings in urine: Secondary | ICD-10-CM | POA: Diagnosis not present

## 2019-07-05 DIAGNOSIS — I10 Essential (primary) hypertension: Secondary | ICD-10-CM | POA: Diagnosis not present

## 2019-07-12 DIAGNOSIS — Z0000 Encounter for general adult medical examination without abnormal findings: Secondary | ICD-10-CM | POA: Diagnosis not present

## 2019-07-12 DIAGNOSIS — I129 Hypertensive chronic kidney disease with stage 1 through stage 4 chronic kidney disease, or unspecified chronic kidney disease: Secondary | ICD-10-CM | POA: Diagnosis not present

## 2019-07-12 DIAGNOSIS — N1831 Chronic kidney disease, stage 3a: Secondary | ICD-10-CM | POA: Diagnosis not present

## 2019-07-12 DIAGNOSIS — K862 Cyst of pancreas: Secondary | ICD-10-CM | POA: Diagnosis not present

## 2019-07-28 DIAGNOSIS — Z0120 Encounter for dental examination and cleaning without abnormal findings: Secondary | ICD-10-CM | POA: Diagnosis not present

## 2019-08-09 DIAGNOSIS — M170 Bilateral primary osteoarthritis of knee: Secondary | ICD-10-CM | POA: Diagnosis not present

## 2019-10-02 ENCOUNTER — Ambulatory Visit: Payer: Medicare Other | Attending: Internal Medicine

## 2019-10-02 DIAGNOSIS — Z23 Encounter for immunization: Secondary | ICD-10-CM | POA: Insufficient documentation

## 2019-10-02 NOTE — Progress Notes (Signed)
   Covid-19 Vaccination Clinic  Name:  Eric Barker    MRN: [id number] DOB: 1934/06/07  10/02/2019  [name] was observed post Covid-19 immunization for 15 minutes without incidence. He was provided with Vaccine Information Sheet and instruction to access the V-Safe system.   [name] was instructed to call 911 with any severe reactions post vaccine: Marland Kitchen Difficulty breathing  . Swelling of your face and throat  . A fast heartbeat  . A bad rash all over your body  . Dizziness and weakness    Immunizations Administered    Name Date Dose VIS Date Route   Pfizer COVID-19 Vaccine 10/02/2019 12:04 PM 0.3 mL 08/19/2019 Intramuscular   Manufacturer: [place]   Lot: [id number]   [place]: [id number]

## 2019-10-21 ENCOUNTER — Ambulatory Visit: Payer: Medicare Other | Attending: Internal Medicine

## 2019-10-21 DIAGNOSIS — Z23 Encounter for immunization: Secondary | ICD-10-CM | POA: Insufficient documentation

## 2019-10-21 NOTE — Progress Notes (Signed)
   Covid-19 Vaccination Clinic  Name:  DEONTE NOE    MRN: [id number] DOB: 02-03-1934  10/21/2019  Mr. Bouse was observed post Covid-19 immunization for 15 minutes without incidence. He was provided with Vaccine Information Sheet and instruction to access the V-Safe system.   Mr. Sandefer was instructed to call 911 with any severe reactions post vaccine: Marland Kitchen Difficulty breathing  . Swelling of your face and throat  . A fast heartbeat  . A bad rash all over your body  . Dizziness and weakness    Immunizations Administered    Name Date Dose VIS Date Route   Pfizer COVID-19 Vaccine 10/21/2019 11:42 AM 0.3 mL 08/19/2019 Intramuscular   Manufacturer: [place]   Lot: [id number]   [place]: [id number]

## 2019-11-02 DIAGNOSIS — M542 Cervicalgia: Secondary | ICD-10-CM | POA: Diagnosis not present

## 2019-11-02 DIAGNOSIS — M25512 Pain in left shoulder: Secondary | ICD-10-CM | POA: Diagnosis not present

## 2019-11-08 DIAGNOSIS — D649 Anemia, unspecified: Secondary | ICD-10-CM | POA: Diagnosis not present

## 2019-11-22 DIAGNOSIS — M542 Cervicalgia: Secondary | ICD-10-CM | POA: Diagnosis not present

## 2019-12-03 DIAGNOSIS — M542 Cervicalgia: Secondary | ICD-10-CM | POA: Diagnosis not present

## 2019-12-08 DIAGNOSIS — M5030 Other cervical disc degeneration, unspecified cervical region: Secondary | ICD-10-CM | POA: Diagnosis not present

## 2019-12-27 DIAGNOSIS — R58 Hemorrhage, not elsewhere classified: Secondary | ICD-10-CM | POA: Diagnosis not present

## 2019-12-27 DIAGNOSIS — M25561 Pain in right knee: Secondary | ICD-10-CM | POA: Diagnosis not present

## 2019-12-27 DIAGNOSIS — M25562 Pain in left knee: Secondary | ICD-10-CM | POA: Diagnosis not present

## 2019-12-27 DIAGNOSIS — M5090 Cervical disc disorder, unspecified, unspecified cervical region: Secondary | ICD-10-CM | POA: Diagnosis not present

## 2020-01-03 DIAGNOSIS — M5030 Other cervical disc degeneration, unspecified cervical region: Secondary | ICD-10-CM | POA: Diagnosis not present

## 2020-01-13 DIAGNOSIS — R200 Anesthesia of skin: Secondary | ICD-10-CM | POA: Diagnosis not present

## 2020-01-13 DIAGNOSIS — M5030 Other cervical disc degeneration, unspecified cervical region: Secondary | ICD-10-CM | POA: Diagnosis not present

## 2020-01-28 DIAGNOSIS — G5602 Carpal tunnel syndrome, left upper limb: Secondary | ICD-10-CM | POA: Diagnosis not present

## 2020-02-02 DIAGNOSIS — Z0120 Encounter for dental examination and cleaning without abnormal findings: Secondary | ICD-10-CM | POA: Diagnosis not present

## 2020-02-13 DIAGNOSIS — M79642 Pain in left hand: Secondary | ICD-10-CM | POA: Diagnosis not present

## 2020-02-13 DIAGNOSIS — M542 Cervicalgia: Secondary | ICD-10-CM | POA: Diagnosis not present

## 2020-02-13 DIAGNOSIS — G5602 Carpal tunnel syndrome, left upper limb: Secondary | ICD-10-CM | POA: Diagnosis not present

## 2020-02-13 DIAGNOSIS — M5030 Other cervical disc degeneration, unspecified cervical region: Secondary | ICD-10-CM | POA: Diagnosis not present

## 2020-02-17 DIAGNOSIS — H524 Presbyopia: Secondary | ICD-10-CM | POA: Diagnosis not present

## 2020-03-14 ENCOUNTER — Other Ambulatory Visit: Payer: Self-pay | Admitting: Nurse Practitioner

## 2020-03-14 DIAGNOSIS — C250 Malignant neoplasm of head of pancreas: Secondary | ICD-10-CM

## 2020-03-27 ENCOUNTER — Other Ambulatory Visit: Payer: Self-pay

## 2020-03-27 ENCOUNTER — Ambulatory Visit
Admission: RE | Admit: 2020-03-27 | Discharge: 2020-03-27 | Disposition: A | Discharge status: Home / Self Care | Payer: Medicare Other | Source: Ambulatory Visit | Attending: Nurse Practitioner | Admitting: Nurse Practitioner

## 2020-03-27 DIAGNOSIS — K862 Cyst of pancreas: Secondary | ICD-10-CM | POA: Diagnosis not present

## 2020-03-27 DIAGNOSIS — C250 Malignant neoplasm of head of pancreas: Secondary | ICD-10-CM

## 2020-03-27 MED ORDER — IOPAMIDOL (ISOVUE-300) INJECTION 61%
60.0000 mL | Freq: Once | INTRAVENOUS | Status: AC | PRN
  Administered 2020-03-27: 15:00:00 60 mL via INTRAVENOUS

## 2020-04-02 DIAGNOSIS — K862 Cyst of pancreas: Secondary | ICD-10-CM | POA: Diagnosis not present

## 2020-04-23 DIAGNOSIS — M1711 Unilateral primary osteoarthritis, right knee: Secondary | ICD-10-CM | POA: Diagnosis not present

## 2020-04-23 DIAGNOSIS — M170 Bilateral primary osteoarthritis of knee: Secondary | ICD-10-CM | POA: Diagnosis not present

## 2020-04-23 DIAGNOSIS — M1712 Unilateral primary osteoarthritis, left knee: Secondary | ICD-10-CM | POA: Diagnosis not present

## 2020-05-03 DIAGNOSIS — H524 Presbyopia: Secondary | ICD-10-CM | POA: Diagnosis not present

## 2020-05-10 DIAGNOSIS — H524 Presbyopia: Secondary | ICD-10-CM | POA: Diagnosis not present

## 2020-05-17 DIAGNOSIS — D692 Other nonthrombocytopenic purpura: Secondary | ICD-10-CM | POA: Diagnosis not present

## 2020-05-17 DIAGNOSIS — S40811A Abrasion of right upper arm, initial encounter: Secondary | ICD-10-CM | POA: Diagnosis not present

## 2020-05-17 DIAGNOSIS — R58 Hemorrhage, not elsewhere classified: Secondary | ICD-10-CM | POA: Diagnosis not present

## 2020-07-04 DIAGNOSIS — L718 Other rosacea: Secondary | ICD-10-CM | POA: Diagnosis not present

## 2020-07-04 DIAGNOSIS — D229 Melanocytic nevi, unspecified: Secondary | ICD-10-CM | POA: Diagnosis not present

## 2020-07-04 DIAGNOSIS — L814 Other melanin hyperpigmentation: Secondary | ICD-10-CM | POA: Diagnosis not present

## 2020-07-04 DIAGNOSIS — C44519 Basal cell carcinoma of skin of other part of trunk: Secondary | ICD-10-CM | POA: Diagnosis not present

## 2020-07-04 DIAGNOSIS — L821 Other seborrheic keratosis: Secondary | ICD-10-CM | POA: Diagnosis not present

## 2020-07-10 DIAGNOSIS — E785 Hyperlipidemia, unspecified: Secondary | ICD-10-CM | POA: Diagnosis not present

## 2020-07-10 DIAGNOSIS — E559 Vitamin D deficiency, unspecified: Secondary | ICD-10-CM | POA: Diagnosis not present

## 2020-07-10 DIAGNOSIS — Z125 Encounter for screening for malignant neoplasm of prostate: Secondary | ICD-10-CM | POA: Diagnosis not present

## 2020-07-17 DIAGNOSIS — E785 Hyperlipidemia, unspecified: Secondary | ICD-10-CM | POA: Diagnosis not present

## 2020-07-17 DIAGNOSIS — E559 Vitamin D deficiency, unspecified: Secondary | ICD-10-CM | POA: Diagnosis not present

## 2020-07-17 DIAGNOSIS — D649 Anemia, unspecified: Secondary | ICD-10-CM | POA: Diagnosis not present

## 2020-07-17 DIAGNOSIS — Z0000 Encounter for general adult medical examination without abnormal findings: Secondary | ICD-10-CM | POA: Diagnosis not present

## 2020-07-18 DIAGNOSIS — I10 Essential (primary) hypertension: Secondary | ICD-10-CM | POA: Diagnosis not present

## 2020-07-18 DIAGNOSIS — R82998 Other abnormal findings in urine: Secondary | ICD-10-CM | POA: Diagnosis not present

## 2020-07-23 DIAGNOSIS — M170 Bilateral primary osteoarthritis of knee: Secondary | ICD-10-CM | POA: Diagnosis not present

## 2020-10-29 DIAGNOSIS — M1711 Unilateral primary osteoarthritis, right knee: Secondary | ICD-10-CM | POA: Diagnosis not present

## 2020-10-29 DIAGNOSIS — M170 Bilateral primary osteoarthritis of knee: Secondary | ICD-10-CM | POA: Diagnosis not present

## 2020-10-29 DIAGNOSIS — M1712 Unilateral primary osteoarthritis, left knee: Secondary | ICD-10-CM | POA: Diagnosis not present

## 2020-11-12 DIAGNOSIS — M170 Bilateral primary osteoarthritis of knee: Secondary | ICD-10-CM | POA: Diagnosis not present

## 2020-11-12 DIAGNOSIS — M1711 Unilateral primary osteoarthritis, right knee: Secondary | ICD-10-CM | POA: Diagnosis not present

## 2020-11-12 DIAGNOSIS — M1712 Unilateral primary osteoarthritis, left knee: Secondary | ICD-10-CM | POA: Diagnosis not present

## 2021-02-08 DIAGNOSIS — L814 Other melanin hyperpigmentation: Secondary | ICD-10-CM | POA: Diagnosis not present

## 2021-02-08 DIAGNOSIS — L905 Scar conditions and fibrosis of skin: Secondary | ICD-10-CM | POA: Diagnosis not present

## 2021-02-08 DIAGNOSIS — D2222 Melanocytic nevi of left ear and external auricular canal: Secondary | ICD-10-CM | POA: Diagnosis not present

## 2021-02-08 DIAGNOSIS — L821 Other seborrheic keratosis: Secondary | ICD-10-CM | POA: Diagnosis not present

## 2021-02-08 DIAGNOSIS — C44311 Basal cell carcinoma of skin of nose: Secondary | ICD-10-CM | POA: Diagnosis not present

## 2021-02-08 DIAGNOSIS — Z85828 Personal history of other malignant neoplasm of skin: Secondary | ICD-10-CM | POA: Diagnosis not present

## 2021-03-21 DIAGNOSIS — C44319 Basal cell carcinoma of skin of other parts of face: Secondary | ICD-10-CM | POA: Diagnosis not present

## 2021-04-05 DIAGNOSIS — H5203 Hypermetropia, bilateral: Secondary | ICD-10-CM | POA: Diagnosis not present

## 2021-04-19 DIAGNOSIS — L820 Inflamed seborrheic keratosis: Secondary | ICD-10-CM | POA: Diagnosis not present

## 2021-06-20 DIAGNOSIS — H5203 Hypermetropia, bilateral: Secondary | ICD-10-CM | POA: Diagnosis not present

## 2021-07-08 DIAGNOSIS — M170 Bilateral primary osteoarthritis of knee: Secondary | ICD-10-CM | POA: Diagnosis not present

## 2021-07-22 DIAGNOSIS — M1711 Unilateral primary osteoarthritis, right knee: Secondary | ICD-10-CM | POA: Diagnosis not present

## 2021-07-23 DIAGNOSIS — I10 Essential (primary) hypertension: Secondary | ICD-10-CM | POA: Diagnosis not present

## 2021-07-23 DIAGNOSIS — E559 Vitamin D deficiency, unspecified: Secondary | ICD-10-CM | POA: Diagnosis not present

## 2021-07-23 DIAGNOSIS — R946 Abnormal results of thyroid function studies: Secondary | ICD-10-CM | POA: Diagnosis not present

## 2021-07-23 DIAGNOSIS — E785 Hyperlipidemia, unspecified: Secondary | ICD-10-CM | POA: Diagnosis not present

## 2021-08-07 DIAGNOSIS — I10 Essential (primary) hypertension: Secondary | ICD-10-CM | POA: Diagnosis not present

## 2022-04-07 DIAGNOSIS — H5203 Hypermetropia, bilateral: Secondary | ICD-10-CM | POA: Diagnosis not present

## 2022-05-19 DIAGNOSIS — H5213 Myopia, bilateral: Secondary | ICD-10-CM | POA: Diagnosis not present

## 2022-08-05 DIAGNOSIS — I10 Essential (primary) hypertension: Secondary | ICD-10-CM | POA: Diagnosis not present

## 2022-08-05 DIAGNOSIS — E785 Hyperlipidemia, unspecified: Secondary | ICD-10-CM | POA: Diagnosis not present

## 2022-08-05 DIAGNOSIS — R5383 Other fatigue: Secondary | ICD-10-CM | POA: Diagnosis not present

## 2022-08-05 DIAGNOSIS — E559 Vitamin D deficiency, unspecified: Secondary | ICD-10-CM | POA: Diagnosis not present

## 2022-08-07 DIAGNOSIS — H524 Presbyopia: Secondary | ICD-10-CM | POA: Diagnosis not present

## 2022-08-11 DIAGNOSIS — R82998 Other abnormal findings in urine: Secondary | ICD-10-CM | POA: Diagnosis not present

## 2022-08-11 DIAGNOSIS — E559 Vitamin D deficiency, unspecified: Secondary | ICD-10-CM | POA: Diagnosis not present

## 2022-08-11 DIAGNOSIS — I129 Hypertensive chronic kidney disease with stage 1 through stage 4 chronic kidney disease, or unspecified chronic kidney disease: Secondary | ICD-10-CM | POA: Diagnosis not present

## 2022-08-11 DIAGNOSIS — Z23 Encounter for immunization: Secondary | ICD-10-CM | POA: Diagnosis not present

## 2022-08-11 DIAGNOSIS — Z1331 Encounter for screening for depression: Secondary | ICD-10-CM | POA: Diagnosis not present

## 2022-08-11 DIAGNOSIS — D692 Other nonthrombocytopenic purpura: Secondary | ICD-10-CM | POA: Diagnosis not present

## 2022-08-11 DIAGNOSIS — Z0000 Encounter for general adult medical examination without abnormal findings: Secondary | ICD-10-CM | POA: Diagnosis not present

## 2022-08-11 DIAGNOSIS — Z1339 Encounter for screening examination for other mental health and behavioral disorders: Secondary | ICD-10-CM | POA: Diagnosis not present

## 2022-09-22 DIAGNOSIS — R4181 Age-related cognitive decline: Secondary | ICD-10-CM | POA: Diagnosis not present

## 2022-09-22 DIAGNOSIS — R413 Other amnesia: Secondary | ICD-10-CM | POA: Diagnosis not present

## 2022-09-24 ENCOUNTER — Other Ambulatory Visit: Payer: Self-pay | Admitting: Family Medicine

## 2022-09-24 DIAGNOSIS — R413 Other amnesia: Secondary | ICD-10-CM

## 2022-10-12 ENCOUNTER — Ambulatory Visit
Admission: RE | Admit: 2022-10-12 | Discharge: 2022-10-12 | Disposition: A | Discharge status: Home / Self Care | Payer: Medicare Other | Source: Ambulatory Visit | Attending: Family Medicine | Admitting: Family Medicine

## 2022-10-12 DIAGNOSIS — G939 Disorder of brain, unspecified: Secondary | ICD-10-CM | POA: Diagnosis not present

## 2022-10-12 DIAGNOSIS — J341 Cyst and mucocele of nose and nasal sinus: Secondary | ICD-10-CM | POA: Diagnosis not present

## 2022-10-12 DIAGNOSIS — J3489 Other specified disorders of nose and nasal sinuses: Secondary | ICD-10-CM | POA: Diagnosis not present

## 2022-10-12 DIAGNOSIS — R413 Other amnesia: Secondary | ICD-10-CM

## 2022-10-12 DIAGNOSIS — I6782 Cerebral ischemia: Secondary | ICD-10-CM | POA: Diagnosis not present

## 2023-03-11 DIAGNOSIS — R058 Other specified cough: Secondary | ICD-10-CM | POA: Diagnosis not present

## 2023-03-11 DIAGNOSIS — R0981 Nasal congestion: Secondary | ICD-10-CM | POA: Diagnosis not present

## 2023-03-11 DIAGNOSIS — U071 COVID-19: Secondary | ICD-10-CM | POA: Diagnosis not present

## 2023-04-14 DIAGNOSIS — H524 Presbyopia: Secondary | ICD-10-CM | POA: Diagnosis not present

## 2023-05-13 DIAGNOSIS — Z23 Encounter for immunization: Secondary | ICD-10-CM | POA: Diagnosis not present

## 2023-05-13 DIAGNOSIS — D333 Benign neoplasm of cranial nerves: Secondary | ICD-10-CM | POA: Diagnosis not present

## 2023-07-24 DIAGNOSIS — G912 (Idiopathic) normal pressure hydrocephalus: Secondary | ICD-10-CM | POA: Diagnosis not present

## 2023-08-10 DIAGNOSIS — E785 Hyperlipidemia, unspecified: Secondary | ICD-10-CM | POA: Diagnosis not present

## 2023-08-10 DIAGNOSIS — E559 Vitamin D deficiency, unspecified: Secondary | ICD-10-CM | POA: Diagnosis not present

## 2023-08-10 DIAGNOSIS — Z125 Encounter for screening for malignant neoplasm of prostate: Secondary | ICD-10-CM | POA: Diagnosis not present

## 2023-08-10 DIAGNOSIS — E039 Hypothyroidism, unspecified: Secondary | ICD-10-CM | POA: Diagnosis not present

## 2023-08-17 DIAGNOSIS — Z1331 Encounter for screening for depression: Secondary | ICD-10-CM | POA: Diagnosis not present

## 2023-08-17 DIAGNOSIS — N1832 Chronic kidney disease, stage 3b: Secondary | ICD-10-CM | POA: Diagnosis not present

## 2023-08-17 DIAGNOSIS — Z23 Encounter for immunization: Secondary | ICD-10-CM | POA: Diagnosis not present

## 2023-08-17 DIAGNOSIS — Z1339 Encounter for screening examination for other mental health and behavioral disorders: Secondary | ICD-10-CM | POA: Diagnosis not present

## 2023-08-17 DIAGNOSIS — I10 Essential (primary) hypertension: Secondary | ICD-10-CM | POA: Diagnosis not present

## 2023-08-17 DIAGNOSIS — I129 Hypertensive chronic kidney disease with stage 1 through stage 4 chronic kidney disease, or unspecified chronic kidney disease: Secondary | ICD-10-CM | POA: Diagnosis not present

## 2023-10-06 DIAGNOSIS — M170 Bilateral primary osteoarthritis of knee: Secondary | ICD-10-CM | POA: Diagnosis not present

## 2023-11-18 DIAGNOSIS — E039 Hypothyroidism, unspecified: Secondary | ICD-10-CM | POA: Diagnosis not present

## 2023-11-18 DIAGNOSIS — I129 Hypertensive chronic kidney disease with stage 1 through stage 4 chronic kidney disease, or unspecified chronic kidney disease: Secondary | ICD-10-CM | POA: Diagnosis not present

## 2024-04-14 DIAGNOSIS — H524 Presbyopia: Secondary | ICD-10-CM | POA: Diagnosis not present

## 2024-06-20 DIAGNOSIS — K080 Exfoliation of teeth due to systemic causes: Secondary | ICD-10-CM | POA: Diagnosis not present

## 2024-08-12 DIAGNOSIS — E7849 Other hyperlipidemia: Secondary | ICD-10-CM | POA: Diagnosis not present

## 2024-08-12 DIAGNOSIS — D649 Anemia, unspecified: Secondary | ICD-10-CM | POA: Diagnosis not present

## 2024-08-19 DIAGNOSIS — I10 Essential (primary) hypertension: Secondary | ICD-10-CM | POA: Diagnosis not present
